# Patient Record
Sex: Male | Born: 1955 | Race: Black or African American | Hispanic: No | Marital: Married | Smoking: Never smoker
Health system: Southern US, Community
[De-identification: ages and names within clinical notes are randomized; demographics above are authoritative.]

## PROBLEM LIST (undated history)

## (undated) DIAGNOSIS — T7840XA Allergy, unspecified, initial encounter: Secondary | ICD-10-CM

## (undated) DIAGNOSIS — E785 Hyperlipidemia, unspecified: Secondary | ICD-10-CM

## (undated) DIAGNOSIS — J45909 Unspecified asthma, uncomplicated: Secondary | ICD-10-CM

## (undated) DIAGNOSIS — F329 Major depressive disorder, single episode, unspecified: Secondary | ICD-10-CM

## (undated) DIAGNOSIS — I10 Essential (primary) hypertension: Secondary | ICD-10-CM

## (undated) DIAGNOSIS — Z8719 Personal history of other diseases of the digestive system: Secondary | ICD-10-CM

## (undated) DIAGNOSIS — F32A Depression, unspecified: Secondary | ICD-10-CM

## (undated) DIAGNOSIS — E119 Type 2 diabetes mellitus without complications: Secondary | ICD-10-CM

## (undated) HISTORY — DX: Unspecified asthma, uncomplicated: J45.909

## (undated) HISTORY — DX: Essential (primary) hypertension: I10

## (undated) HISTORY — PX: SINUS IRRIGATION: SHX2411

## (undated) HISTORY — DX: Hyperlipidemia, unspecified: E78.5

## (undated) HISTORY — DX: Allergy, unspecified, initial encounter: T78.40XA

## (undated) HISTORY — DX: Type 2 diabetes mellitus without complications: E11.9

## (undated) HISTORY — DX: Major depressive disorder, single episode, unspecified: F32.9

## (undated) HISTORY — DX: Depression, unspecified: F32.A

---

## 2005-08-25 ENCOUNTER — Ambulatory Visit: Payer: Self-pay | Admitting: Internal Medicine

## 2005-09-21 ENCOUNTER — Ambulatory Visit: Payer: Self-pay | Admitting: Internal Medicine

## 2005-12-27 ENCOUNTER — Ambulatory Visit: Payer: Self-pay | Admitting: Internal Medicine

## 2005-12-27 LAB — CONVERTED CEMR LAB
ALT: 17 units/L (ref 0–40)
AST: 20 units/L (ref 0–37)
Albumin: 4.1 g/dL (ref 3.5–5.2)
Alkaline Phosphatase: 53 units/L (ref 39–117)
BUN: 8 mg/dL (ref 6–23)
Basophils Absolute: 0 10*3/uL (ref 0.0–0.1)
Basophils Relative: 0 % (ref 0.0–1.0)
CO2: 32 meq/L (ref 19–32)
Calcium: 9.2 mg/dL (ref 8.4–10.5)
Chloride: 105 meq/L (ref 96–112)
Chol/HDL Ratio, serum: 3.6
Cholesterol: 164 mg/dL (ref 0–200)
Creatinine, Ser: 1.1 mg/dL (ref 0.4–1.5)
Eosinophil percent: 3.1 % (ref 0.0–5.0)
GFR calc non Af Amer: 76 mL/min
Glomerular Filtration Rate, Af Am: 92 mL/min/{1.73_m2}
Glucose, Bld: 97 mg/dL (ref 70–99)
HCT: 47.8 % (ref 39.0–52.0)
HDL: 45.4 mg/dL (ref >39.0)
Hemoglobin: 15.2 g/dL (ref 13.0–17.0)
LDL Cholesterol: 104 mg/dL — ABNORMAL HIGH (ref 0–99)
Lymphocytes Relative: 28 % (ref 12.0–46.0)
MCHC: 31.7 g/dL (ref 30.0–36.0)
MCV: 85.3 fL (ref 78.0–100.0)
Monocytes Absolute: 0.7 10*3/uL (ref 0.2–0.7)
Monocytes Relative: 6.9 % (ref 3.0–11.0)
Neutro Abs: 6.3 10*3/uL (ref 1.4–7.7)
Neutrophils Relative %: 62 % (ref 43.0–77.0)
PSA: 0.72 ng/mL (ref 0.10–4.00)
Platelets: 281 10*3/uL (ref 150–400)
Potassium: 4.1 meq/L (ref 3.5–5.1)
RBC: 5.61 M/uL (ref 4.22–5.81)
RDW: 13.1 % (ref 11.5–14.6)
Sodium: 144 meq/L (ref 135–145)
TSH: 1.23 microintl units/mL (ref 0.35–5.50)
Total Bilirubin: 1.1 mg/dL (ref 0.3–1.2)
Total Protein: 7.8 g/dL (ref 6.0–8.3)
Triglyceride fasting, serum: 72 mg/dL (ref 0–149)
VLDL: 14 mg/dL (ref 0–40)
WBC: 10.2 10*3/uL (ref 4.5–10.5)

## 2006-01-17 ENCOUNTER — Ambulatory Visit: Payer: Self-pay | Admitting: Internal Medicine

## 2006-05-23 ENCOUNTER — Ambulatory Visit: Payer: Self-pay | Admitting: Internal Medicine

## 2006-05-23 LAB — CONVERTED CEMR LAB
ALT: 22 units/L (ref 0–40)
AST: 20 units/L (ref 0–37)
Albumin: 3.9 g/dL (ref 3.5–5.2)
Alkaline Phosphatase: 60 units/L (ref 39–117)
BUN: 11 mg/dL (ref 6–23)
Bilirubin, Direct: 0.1 mg/dL (ref 0.0–0.3)
CO2: 31 meq/L (ref 19–32)
Calcium: 9 mg/dL (ref 8.4–10.5)
Chloride: 106 meq/L (ref 96–112)
Cholesterol: 160 mg/dL (ref 0–200)
Creatinine, Ser: 0.9 mg/dL (ref 0.4–1.5)
GFR calc Af Amer: 115 mL/min
GFR calc non Af Amer: 95 mL/min
Glucose, Bld: 105 mg/dL — ABNORMAL HIGH (ref 70–99)
HDL: 40.1 mg/dL (ref >39.0)
Hgb A1c MFr Bld: 6.8 % — ABNORMAL HIGH (ref 4.6–6.0)
LDL Cholesterol: 104 mg/dL — ABNORMAL HIGH (ref 0–99)
Potassium: 3.9 meq/L (ref 3.5–5.1)
Sodium: 142 meq/L (ref 135–145)
Total Bilirubin: 1.1 mg/dL (ref 0.3–1.2)
Total CHOL/HDL Ratio: 4
Total Protein: 7.7 g/dL (ref 6.0–8.3)
Triglycerides: 81 mg/dL (ref 0–149)
VLDL: 16 mg/dL (ref 0–40)

## 2006-07-20 ENCOUNTER — Ambulatory Visit (HOSPITAL_COMMUNITY): Admission: RE | Admit: 2006-07-20 | Discharge: 2006-07-20 | Payer: Self-pay | Admitting: Internal Medicine

## 2006-07-20 ENCOUNTER — Ambulatory Visit: Payer: Self-pay | Admitting: Internal Medicine

## 2006-07-25 DIAGNOSIS — I10 Essential (primary) hypertension: Secondary | ICD-10-CM

## 2006-07-25 DIAGNOSIS — E119 Type 2 diabetes mellitus without complications: Secondary | ICD-10-CM

## 2006-07-25 DIAGNOSIS — J45909 Unspecified asthma, uncomplicated: Secondary | ICD-10-CM | POA: Insufficient documentation

## 2006-07-25 HISTORY — DX: Unspecified asthma, uncomplicated: J45.909

## 2006-07-25 HISTORY — DX: Type 2 diabetes mellitus without complications: E11.9

## 2006-07-25 HISTORY — DX: Essential (primary) hypertension: I10

## 2006-09-19 ENCOUNTER — Telehealth: Payer: Self-pay | Admitting: Internal Medicine

## 2006-10-17 ENCOUNTER — Ambulatory Visit: Payer: Self-pay | Admitting: Internal Medicine

## 2006-10-17 DIAGNOSIS — J329 Chronic sinusitis, unspecified: Secondary | ICD-10-CM | POA: Insufficient documentation

## 2006-10-19 ENCOUNTER — Telehealth: Payer: Self-pay | Admitting: Internal Medicine

## 2006-10-25 ENCOUNTER — Ambulatory Visit: Payer: Self-pay | Admitting: Internal Medicine

## 2008-01-04 ENCOUNTER — Ambulatory Visit: Payer: Self-pay | Admitting: Internal Medicine

## 2008-01-04 ENCOUNTER — Telehealth: Payer: Self-pay | Admitting: Internal Medicine

## 2008-01-07 DIAGNOSIS — E785 Hyperlipidemia, unspecified: Secondary | ICD-10-CM | POA: Insufficient documentation

## 2008-01-07 HISTORY — DX: Hyperlipidemia, unspecified: E78.5

## 2008-01-09 LAB — CONVERTED CEMR LAB
ALT: 24 units/L (ref 0–53)
AST: 22 units/L (ref 0–37)
CO2: 29 meq/L (ref 19–32)
Calcium: 9.8 mg/dL (ref 8.4–10.5)
Chloride: 103 meq/L (ref 96–112)
Hgb A1c MFr Bld: 6.7 % — ABNORMAL HIGH (ref 4.6–6.0)
Potassium: 4.2 meq/L (ref 3.5–5.1)
Sodium: 142 meq/L (ref 135–145)
Total CHOL/HDL Ratio: 5.3
Triglycerides: 93 mg/dL (ref 0–149)

## 2008-03-07 ENCOUNTER — Telehealth: Payer: Self-pay | Admitting: Internal Medicine

## 2008-10-03 ENCOUNTER — Encounter: Payer: Self-pay | Admitting: Internal Medicine

## 2008-10-14 IMAGING — CR DG CHEST 2V
2 series · 2 of 2 positions shown · non-contrast
Comparison: none

CLINICAL DATA: Chest tightness, cough, asthma, hypertension.
 CHEST ? 2 VIEW:

[w chest pa]
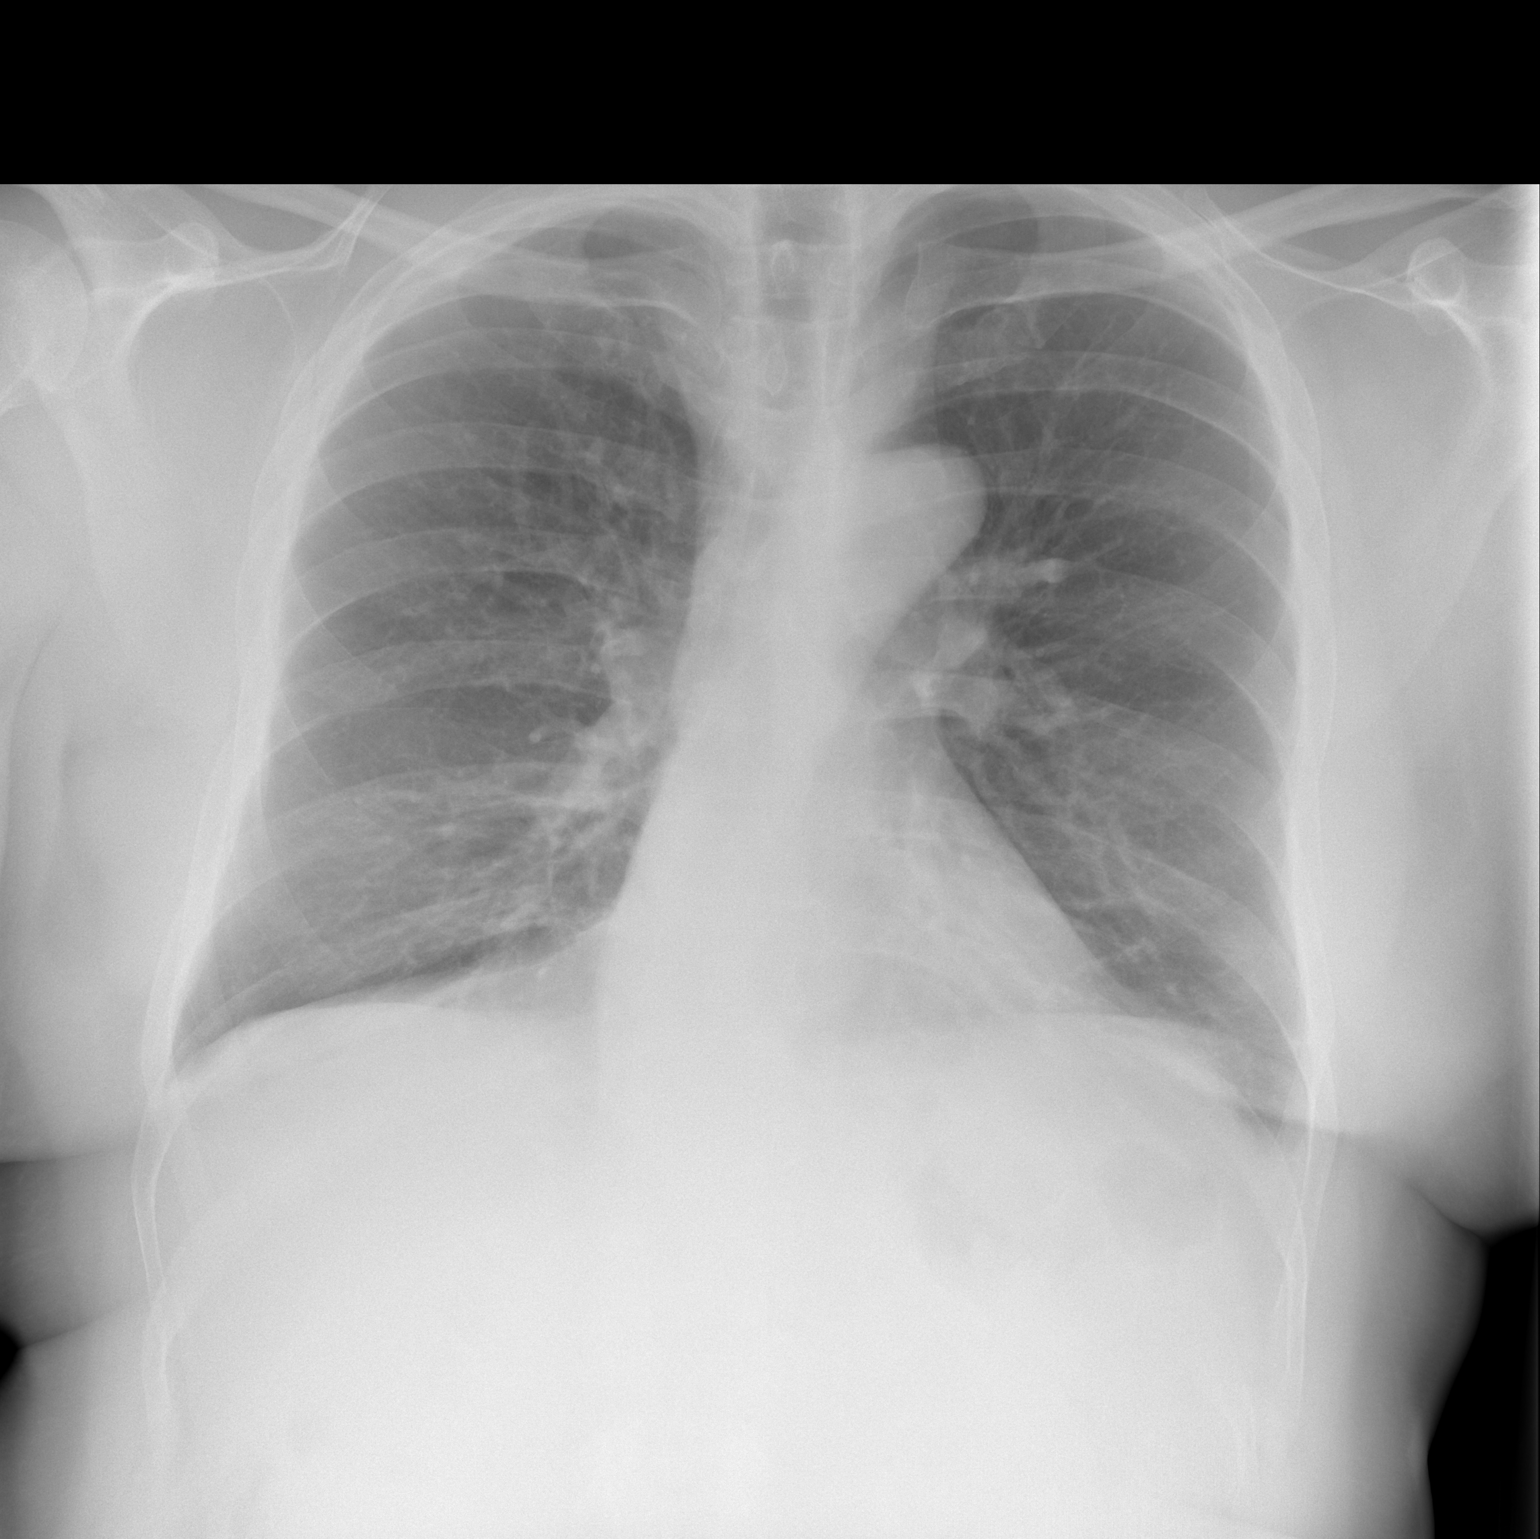

[w chest lat]
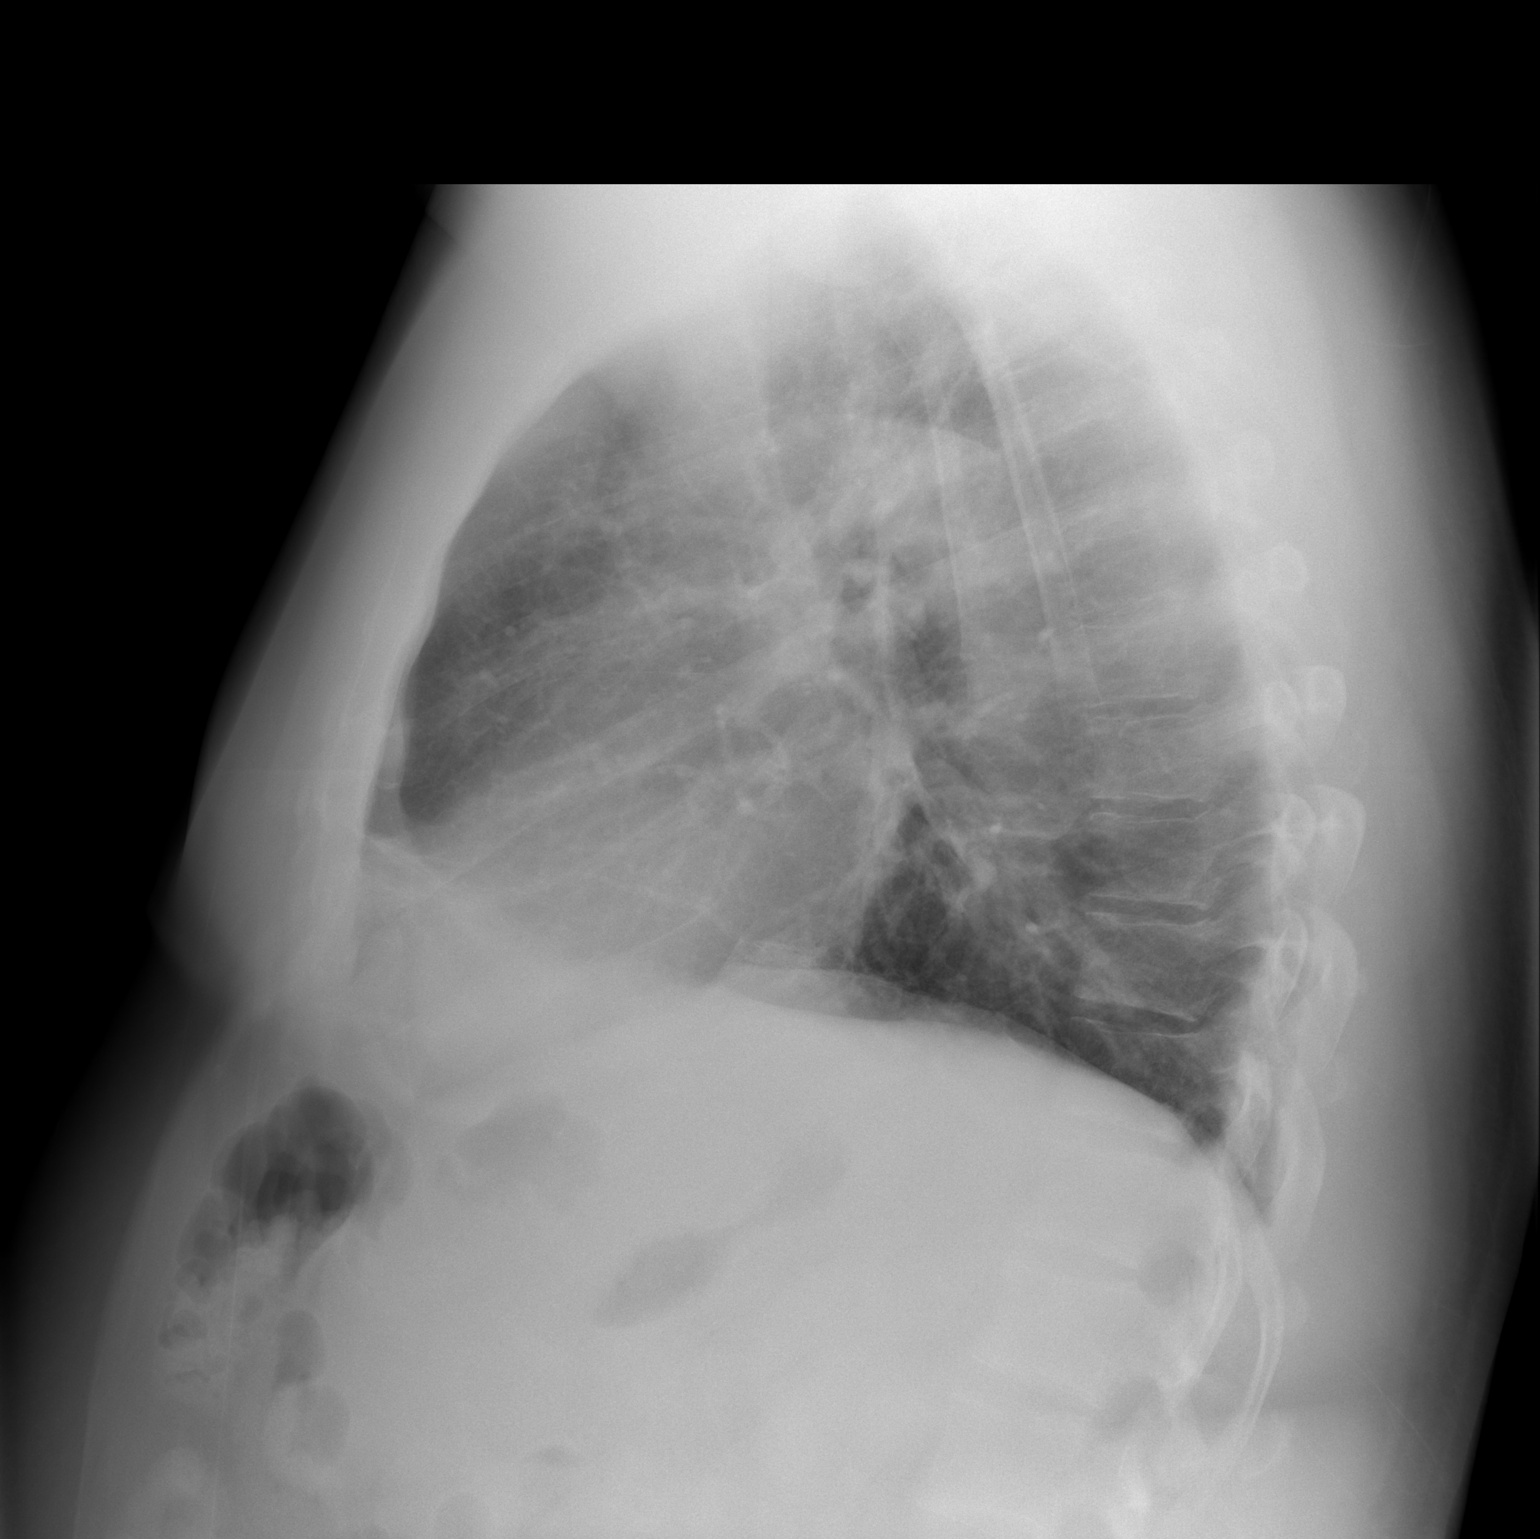

[2 of 2 positions shown; findings below may reference images not displayed]

FINDINGS: Heart size is normal.  The thoracic aorta is slightly unfolded.  The mediastinal shadows are unremarkable.  There are linear pulmonary densities in both lower lungs. This could be scarring or mild atelectasis.  No evidence of consolidation or lobar collapse.  Bony structures are unremarkable.
IMPRESSION: Linear markings in both lower lobes.  It could be scarring or mild atelectasis.

## 2008-10-24 ENCOUNTER — Ambulatory Visit: Payer: Self-pay | Admitting: Internal Medicine

## 2008-10-24 ENCOUNTER — Encounter (INDEPENDENT_AMBULATORY_CARE_PROVIDER_SITE_OTHER): Payer: Self-pay | Admitting: *Deleted

## 2008-10-24 DIAGNOSIS — K625 Hemorrhage of anus and rectum: Secondary | ICD-10-CM

## 2008-10-25 ENCOUNTER — Ambulatory Visit: Payer: Self-pay | Admitting: Gastroenterology

## 2008-10-25 ENCOUNTER — Telehealth (INDEPENDENT_AMBULATORY_CARE_PROVIDER_SITE_OTHER): Payer: Self-pay | Admitting: *Deleted

## 2008-10-25 LAB — CONVERTED CEMR LAB
Eosinophils Absolute: 0.2 10*3/uL (ref 0.0–0.7)
Eosinophils Relative: 2.7 % (ref 0.0–5.0)
MCV: 84.8 fL (ref 78.0–100.0)
Monocytes Absolute: 0.7 10*3/uL (ref 0.1–1.0)
Neutrophils Relative %: 57.7 % (ref 43.0–77.0)
Platelets: 209 10*3/uL (ref 150.0–400.0)
WBC: 8.9 10*3/uL (ref 4.5–10.5)

## 2008-12-13 ENCOUNTER — Ambulatory Visit: Payer: Self-pay | Admitting: Gastroenterology

## 2008-12-13 LAB — HM COLONOSCOPY

## 2008-12-25 ENCOUNTER — Encounter: Payer: Self-pay | Admitting: Gastroenterology

## 2009-01-11 IMAGING — CT CT PARANASAL SINUSES LIMITED
1 of 2 series · 16 of 28 positions shown, 20 images · non-contrast
Comparison: none

CLINICAL DATA: Sinus drainage. Frontal headaches. Congestion.
 LIMITED CT OF PARANASAL SINUSES:
TECHNIQUE: Limited coronal CT images were obtained through the paranasal sinuses without intravenous contrast.

[Series 4: ltd sinus 3.0 h30s · axial · 0.29mm/px · z∈[-99,+13]mm · 16 of 27 slices shown, 20 images]
[im 2/27  brain]
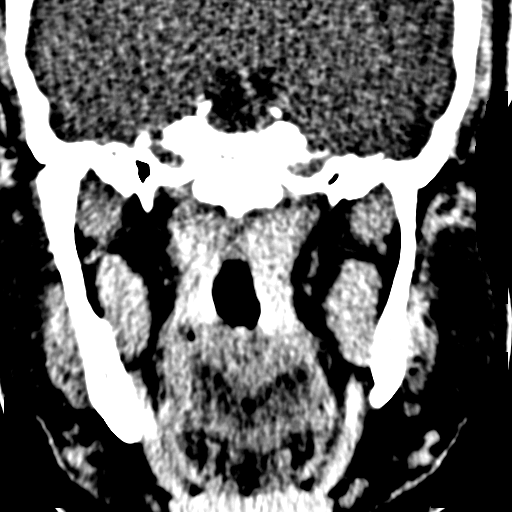
[im 2/27  bone]
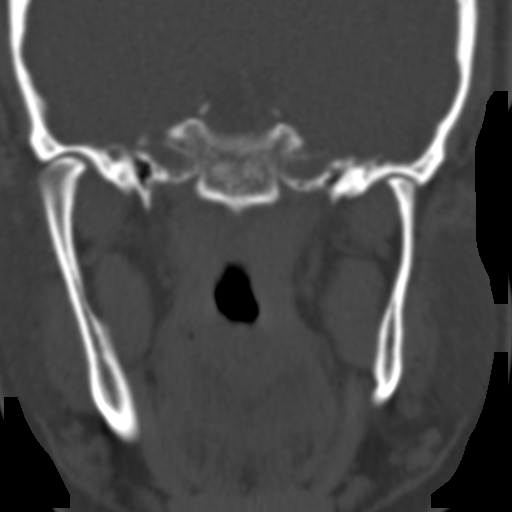
[im 4/27  bone]
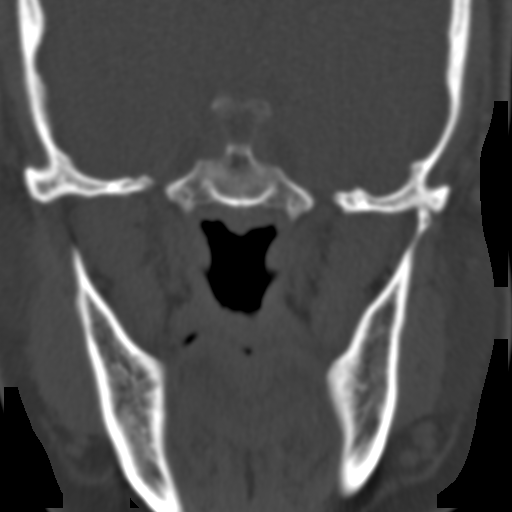
[im 5/27  bone]
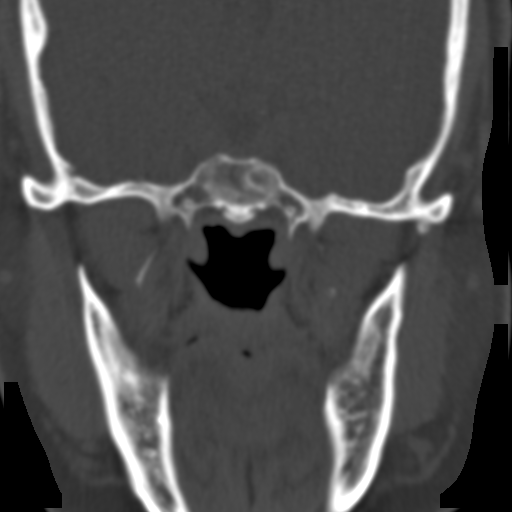
[im 7/27  bone]
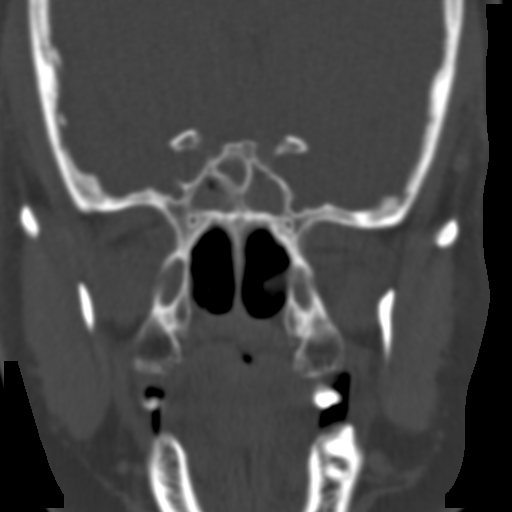
[im 9/27  brain]
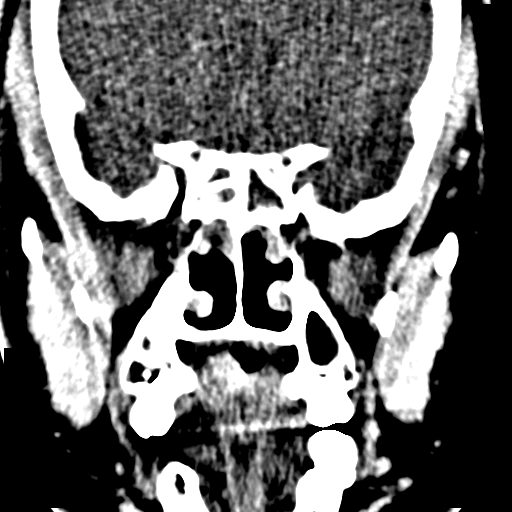
[im 9/27  bone]
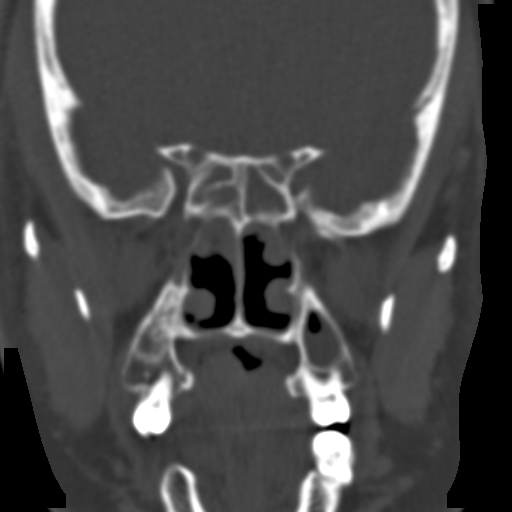
[im 10/27  bone]
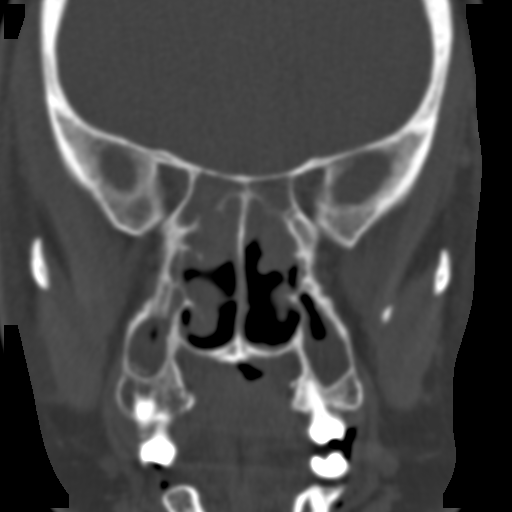
[im 12/27  bone]
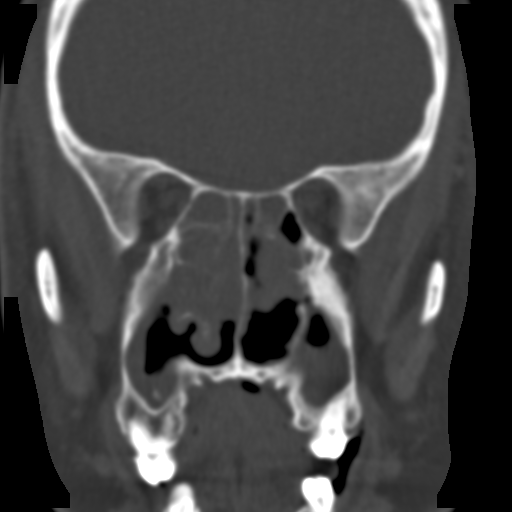
[im 13/27  bone]
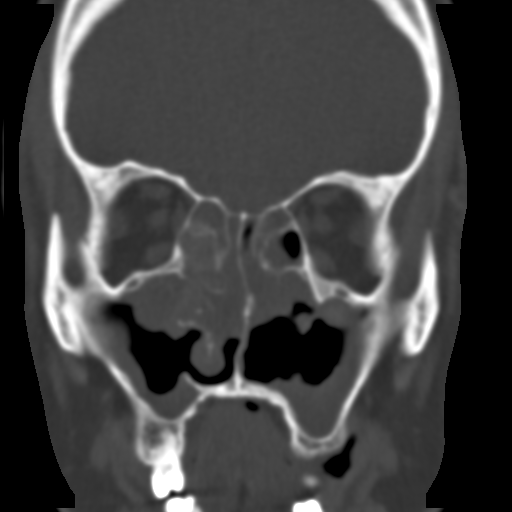
[im 15/27  brain]
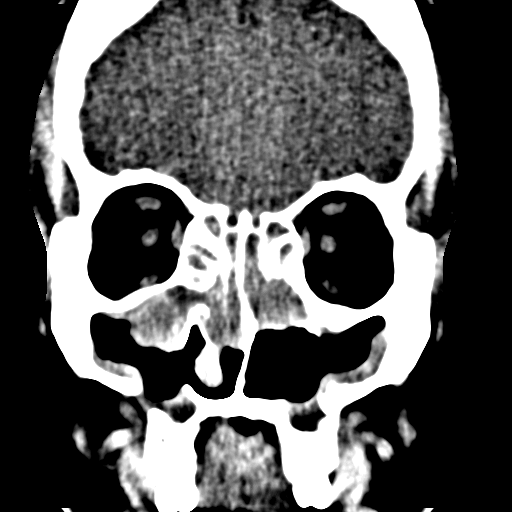
[im 15/27  bone]
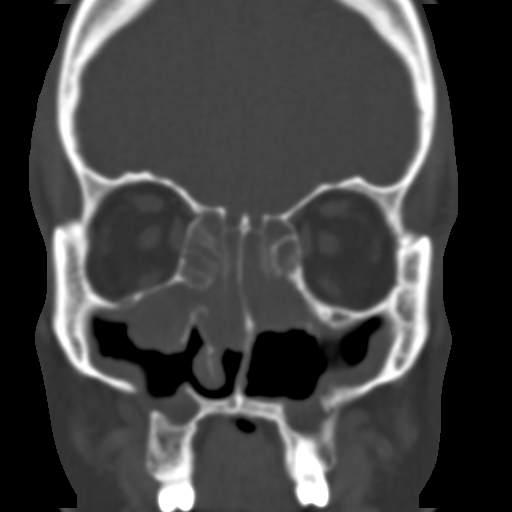
[im 16/27  bone]
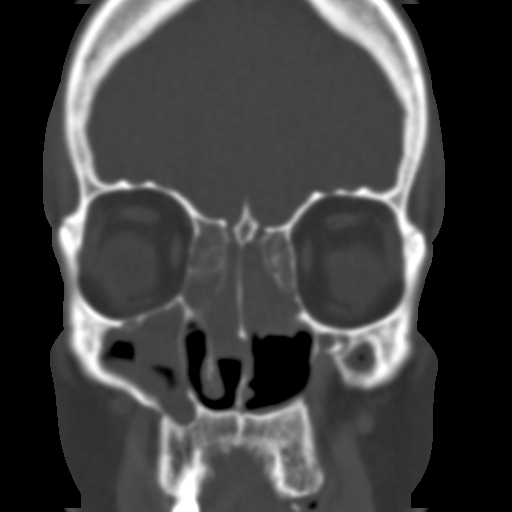
[im 18/27  bone]
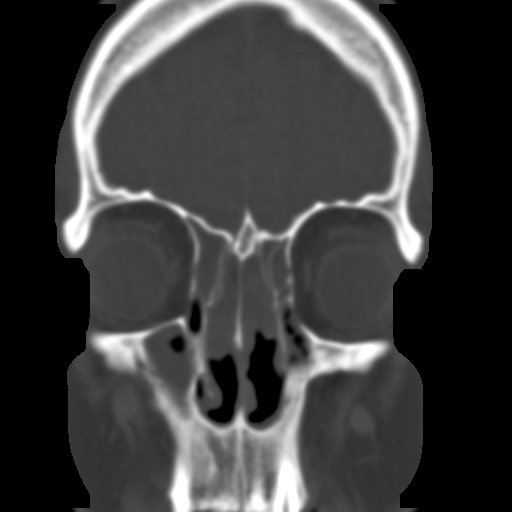
[im 19/27  bone]
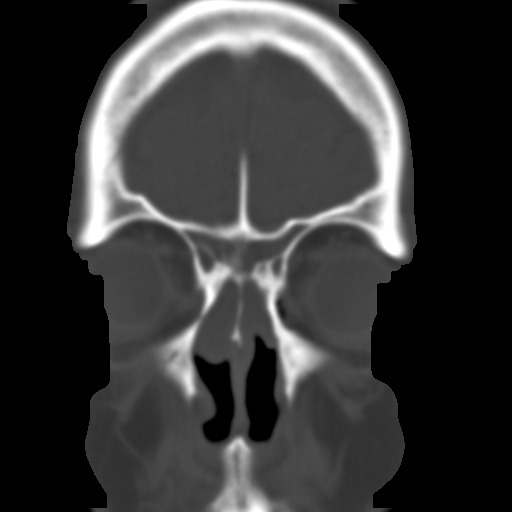
[im 21/27  brain]
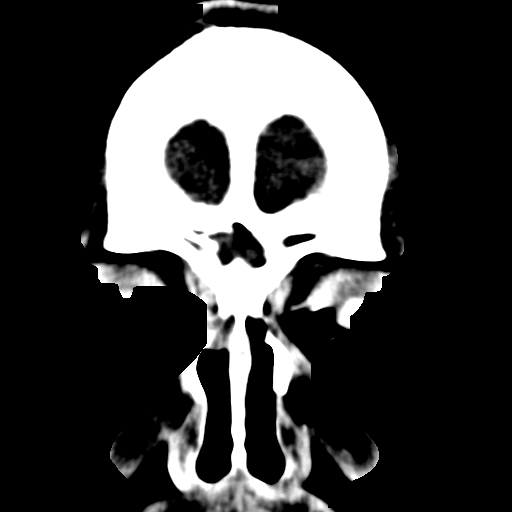
[im 21/27  bone]
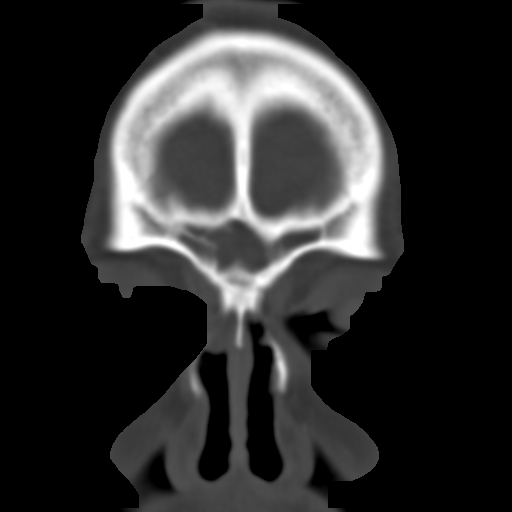
[im 23/27  bone]
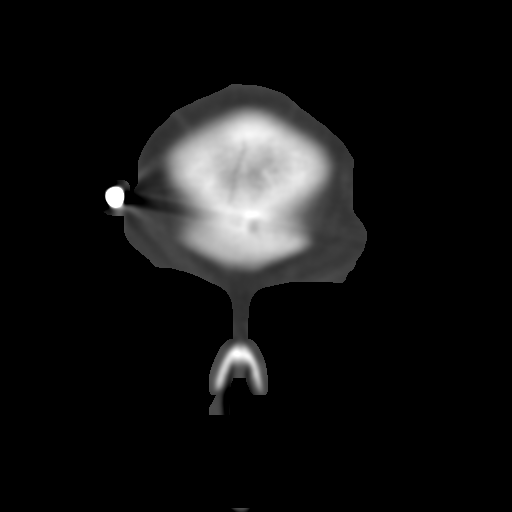
[im 24/27  bone]
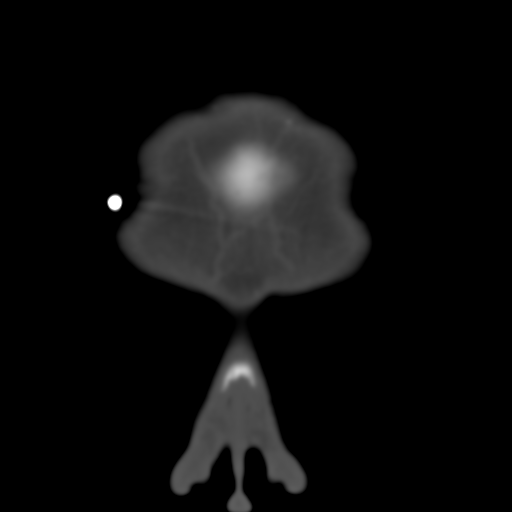
[im 26/27  bone]
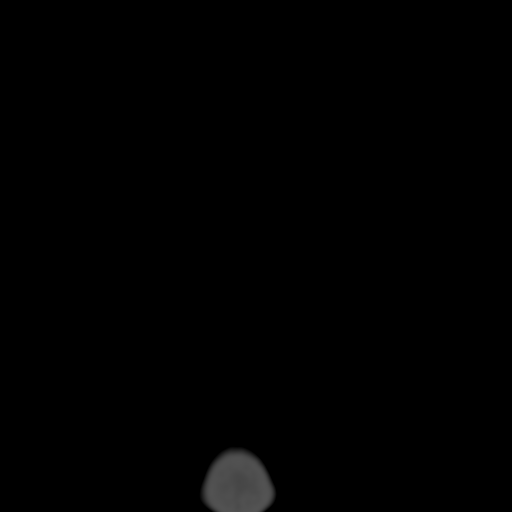

[16 of 28 positions shown; findings below may reference images not displayed]

FINDINGS: The patient appears to have undergone prior bilateral medial antrostomies.  There is opacification of the sphenoid sinus with opacification of the majority of ethmoid air cells and the frontal sinuses.  Nodular mucosal thickening is noted diffusely throughout both maxillary sinuses.  Findings are consistent with pansinusitis.  The nodular character of the mucosal thickening may indicate sinonasal polyposis.  The left inferior turbinate is not visible.  No bony erosion is seen.
IMPRESSION: Pan sinusitis. Consider sinonasal polyposis.

## 2009-04-04 ENCOUNTER — Telehealth: Payer: Self-pay | Admitting: Internal Medicine

## 2010-02-24 NOTE — Progress Notes (Signed)
Summary: simvastatin--needs ov  Phone Note Refill Request Message from:  Fax from Pharmacy  Refills Requested: Medication #1:  SIMVASTATIN 40 MG  TABS one by mouth daily [facility] [phone]      FAX----[phone]  Initial call taken by: Warnell Forester,  April 04, 2009 3:50 PM    New/Updated Medications: SIMVASTATIN 40 MG  TABS (SIMVASTATIN) one by mouth daily--NEEDS OFFICE VISIT FOR ADDITIONAL REFILLS Prescriptions: SIMVASTATIN 40 MG  TABS (SIMVASTATIN) one by mouth daily--NEEDS OFFICE VISIT FOR ADDITIONAL REFILLS  #30 x 0   Entered by:   Gladis Riffle, RN   Authorized by:   Birdie Sons MD   Signed by:   Gladis Riffle, RN on 04/07/2009   Method used:   Electronically to        [facility]. [phone]* (retail)       [address]       [place], [place]  [address]       Ph: [phone]       Fax: [phone]   RxID:   [id number]

## 2010-03-09 ENCOUNTER — Ambulatory Visit (INDEPENDENT_AMBULATORY_CARE_PROVIDER_SITE_OTHER): Payer: 59 | Admitting: Internal Medicine

## 2010-03-09 ENCOUNTER — Encounter: Payer: Self-pay | Admitting: Internal Medicine

## 2010-03-09 DIAGNOSIS — I10 Essential (primary) hypertension: Secondary | ICD-10-CM

## 2010-03-09 DIAGNOSIS — E119 Type 2 diabetes mellitus without complications: Secondary | ICD-10-CM

## 2010-03-09 DIAGNOSIS — E785 Hyperlipidemia, unspecified: Secondary | ICD-10-CM

## 2010-03-09 DIAGNOSIS — N318 Other neuromuscular dysfunction of bladder: Secondary | ICD-10-CM

## 2010-03-09 DIAGNOSIS — N3281 Overactive bladder: Secondary | ICD-10-CM

## 2010-03-09 LAB — LIPID PANEL
HDL: 42.3 mg/dL (ref >39.00)
Total CHOL/HDL Ratio: 5
Triglycerides: 223 mg/dL — ABNORMAL HIGH (ref 0.0–149.0)

## 2010-03-09 LAB — HEPATIC FUNCTION PANEL
Alkaline Phosphatase: 137 U/L — ABNORMAL HIGH (ref 39–117)
Bilirubin, Direct: 0.1 mg/dL (ref 0.0–0.3)
Total Bilirubin: 0.9 mg/dL (ref 0.3–1.2)

## 2010-03-09 LAB — BASIC METABOLIC PANEL
Calcium: 9.9 mg/dL (ref 8.4–10.5)
GFR: 82.83 mL/min (ref >60.00)
Sodium: 132 mEq/L — ABNORMAL LOW (ref 135–145)

## 2010-03-09 LAB — POCT URINALYSIS DIPSTICK
Ketones, UA: NEGATIVE
Nitrite, UA: NEGATIVE
Protein, UA: NEGATIVE
Urobilinogen, UA: 0.2

## 2010-03-09 LAB — POCT CBG (FASTING - GLUCOSE)-MANUAL ENTRY: Glucose Fasting, POC: HIGH mg/dL

## 2010-03-09 MED ORDER — INSULIN REGULAR HUMAN 100 UNIT/ML IJ SOLN
10.0000 [IU] | INTRAMUSCULAR | Status: AC
  Administered 2010-03-09 (×2): 10 [IU] via SUBCUTANEOUS

## 2010-03-09 MED ORDER — SIMVASTATIN 40 MG PO TABS: 40.0000 mg | ORAL_TABLET | Freq: Every day | ORAL | Status: DC

## 2010-03-09 MED ORDER — METFORMIN HCL ER (MOD) 500 MG PO TB24: 500.0000 mg | ORAL_TABLET | Freq: Two times a day (BID) | ORAL | Status: DC

## 2010-03-09 MED ORDER — INSULIN REGULAR HUMAN 100 UNIT/ML IJ SOLN
10.0000 [IU] | Freq: Once | INTRAMUSCULAR | Status: AC
  Administered 2010-03-09: 10 [IU] via SUBCUTANEOUS

## 2010-03-09 MED ORDER — GLYBURIDE 2.5 MG PO TABS: 2.5000 mg | ORAL_TABLET | Freq: Every day | ORAL | Status: DC

## 2010-03-09 MED ORDER — OLMESARTAN MEDOXOMIL-HCTZ 40-25 MG PO TABS: 1.0000 | ORAL_TABLET | Freq: Every day | ORAL | Status: DC

## 2010-03-09 NOTE — Progress Notes (Signed)
  Subjective:    Patient ID: Christopher Serrano, male    DOB: 1955-06-19, 55 y.o.   MRN: [id number]  HPI   patient comes in for 2 complaints. He complains of polyuria, polydipsia, polyphagia. This ongoing for 4 days. He's urinating several times per hour. He is waking up in regard to urinate. Has been drinking a lot of water and cranberry juice. He has not been taking his diabetic medications. He denies any fever chills or any other specific complaints and a complete review of systems. In addition to not taking his metformin he has been out of simvastatin for 3 or 4 days he continues to take his Benicar.  Past Medical History  Diagnosis Date  . DIABETES MELLITUS, TYPE II 07/25/2006  . HYPERTENSION 07/25/2006  . ASTHMA 07/25/2006  . HYPERLIPIDEMIA 01/07/2008   Past Surgical History  Procedure Date  . Sinus irrigation     reports that he has never smoked. He does not have any smokeless tobacco history on file. He reports that he drinks alcohol. He reports that he does not use illicit drugs. family history includes Dementia in his father; Diabetes in his brother; and Hypertension in his mother.  There is no history of Colon cancer.     Review of Systems  patient denies chest pain, shortness of breath, orthopnea. Denies lower extremity edema, abdominal pain, change in appetite, change in bowel movements. Patient denies rashes, musculoskeletal complaints. No other specific complaints in a complete review of systems.      Objective:   Physical Exam  well-developed well-nourished male in no acute distress. HEENT exam atraumatic, normocephalic, neck supple without jugular venous distention. Chest clear to auscultation cardiac exam S1-S2 are regular. Abdominal exam overweight with bowel sounds, soft and nontender. Extremities no edema. Neurologic exam is alert with a normal gait.        Assessment & Plan:

## 2010-03-09 NOTE — Assessment & Plan Note (Signed)
Will check labs today. Continue simvastatin for the time being.

## 2010-03-09 NOTE — Assessment & Plan Note (Addendum)
Blood sugar in the office today was read as high. No documented number. He was hydrated with oral fluids. He was given 10 units of Humalog. We used a sample supply. He'll be monitored in the office and tell his blood sugars are in a readable range. He was advised on diet and exercise. For the time being he will avoid nearly all carbohydrates. He will avoid all concentrated  Sweets. He will limit his diet to lean meats and vegetables and eggs for the time being. I'll see him back in 4-5 days. All of this is discussed with the patient and his wife.   the patient was given multiple doses of Humalog in the office. He was given oral fluids in the office. He was monitored in the office for 3 hours total time.

## 2010-03-09 NOTE — Assessment & Plan Note (Signed)
Adequate control. Continue current medications. 

## 2010-03-10 ENCOUNTER — Ambulatory Visit (INDEPENDENT_AMBULATORY_CARE_PROVIDER_SITE_OTHER): Payer: 59 | Admitting: Internal Medicine

## 2010-03-10 DIAGNOSIS — E119 Type 2 diabetes mellitus without complications: Secondary | ICD-10-CM

## 2010-03-10 NOTE — Progress Notes (Signed)
  Subjective:    Patient ID: Christopher Serrano, male    DOB: 1955-12-26, 55 y.o.   MRN: [id number]  HPI  patient comes in for followup of diabetes. Reviewed yesterday's note. Reviewed past medical history social history, family history, surgical history. He is feeling much better. Less polyuria, polydipsia. He's been maintaining an excellent diet over the past 24 hours.   Review of Systems     Objective:   Physical Exam        Assessment & Plan:

## 2010-03-10 NOTE — Assessment & Plan Note (Signed)
Uncontrolled diabetes. Much improved. Reviewed labs from yesterday including a blood sugar 707. Blood sugar this morning is 374. Much improved. He'll continue to maintain incredibly low carbohydrate diet over the next 2 weeks. Monitor his blood sugar daily. Glucometer given to the patient today. Greater than 15 minutes spent with the patient and his wife SLING.

## 2010-03-26 ENCOUNTER — Encounter: Payer: Self-pay | Admitting: Internal Medicine

## 2010-03-26 ENCOUNTER — Ambulatory Visit (INDEPENDENT_AMBULATORY_CARE_PROVIDER_SITE_OTHER): Payer: 59 | Admitting: Internal Medicine

## 2010-03-26 VITALS — BP 112/90 | Temp 98.4°F | Wt 296.0 lb

## 2010-03-26 DIAGNOSIS — E119 Type 2 diabetes mellitus without complications: Secondary | ICD-10-CM

## 2010-03-29 NOTE — Progress Notes (Signed)
  Subjective:    Patient ID: Christopher Serrano, male    DOB: 07-04-1955, 55 y.o.   MRN: [id number]  HPI   patient comes in for followup of multiple medical problems including type 2 diabetes, hyperlipidemia, hypertension. The patient does not check blood sugar or blood pressure at home. The patetient does not follow an exercise or diet program. The patient denies any polyuria, polydipsia.  In the past the patient has gone to diabetic treatment center. The patient is tolerating medications  Without difficulty. The patient does admit to medication compliance.   Past Medical History  Diagnosis Date  . DIABETES MELLITUS, TYPE II 07/25/2006  . HYPERTENSION 07/25/2006  . ASTHMA 07/25/2006  . HYPERLIPIDEMIA 01/07/2008   Past Surgical History  Procedure Date  . Sinus irrigation     reports that he has never smoked. He does not have any smokeless tobacco history on file. He reports that he drinks alcohol. He reports that he does not use illicit drugs. family history includes Dementia in his father; Diabetes in his brother; and Hypertension in his mother.  There is no history of Colon cancer.    Allergies  Allergen Reactions  . Aspirin   . Ibuprofen     REACTION: sob/tightness     Review of Systems  patient denies chest pain, shortness of breath, orthopnea. Denies lower extremity edema, abdominal pain, change in appetite, change in bowel movements. Patient denies rashes, musculoskeletal complaints. No other specific complaints in a complete review of systems.      Objective:   Physical Exam  Well-developed well-nourished male in no acute distress. HEENT exam atraumatic, normocephalic, extraocular muscles are intact. Neck is supple. No jugular venous distention no thyromegaly. Chest clear to auscultation without increased work of breathing. Cardiac exam S1 and S2 are regular. Abdominal exam active bowel sounds, soft, nontender. Extremities no edema. Neurologic exam she is alert without any motor  sensory deficits. Gait is normal.        Assessment & Plan:

## 2010-03-29 NOTE — Assessment & Plan Note (Signed)
Lab Results  Component Value Date   HGBA1C 11.4* 03/09/2010   POCGLU 376 03/10/2010   CREATININE 1.2 03/09/2010   CHOL 217* 03/09/2010   LDL 104* 05/23/2006   HDL 42.30 03/09/2010   TRIG 223.0* 03/09/2010    Last eye exam and foot exam: patient will schedule an eye and foot exam.  Assessment: Diabetes control: not controlled Progress toward goals: improved Barriers to meeting goals:  patient was noncompliant wwiithh dieett eexercise annd medications. this is improved. he will continue low carbohydrate ddiet. Have advised regular exercisee.Leanne Lovely  aaddvviiseedd progressive weight loss.  Plan: Diabetes treatment: continue current medications Refer to: none Instruction/counseling given: reminded to get eye exam, reminded to bring medications to each visit, discussed foot care, discussed the need for weight loss and discussed diet

## 2010-05-06 ENCOUNTER — Telehealth: Payer: Self-pay | Admitting: *Deleted

## 2010-05-06 DIAGNOSIS — E119 Type 2 diabetes mellitus without complications: Secondary | ICD-10-CM

## 2010-05-06 NOTE — Telephone Encounter (Signed)
Pt was switched from metformin to glipizide.  Glipizide is not covered under his insurance and he wants to go back metformin.  The metformin worked

## 2010-05-07 MED ORDER — GLYBURIDE 2.5 MG PO TABS: 2.5000 mg | ORAL_TABLET | Freq: Every day | ORAL | Status: DC

## 2010-05-07 NOTE — Telephone Encounter (Signed)
rx was [facility], its possible that he could get it free there.

## 2010-06-12 NOTE — Assessment & Plan Note (Signed)
[facility]                                 ON-CALL NOTE   AURELIANO, OSHIELDS                        MRN:          [id number]  DATE:12/14/2008                            DOB:          07-23-1955    Mr. Zayas wife called to say that her husband is complaining of  being cold.  He underwent colonoscopy earlier in the day.  He is without  fever or chills, but has been having difficulty warming up.  He is  without abdominal pain.   I instructed Mrs. Knick to contact me if he develops pain, fever, or  chills.     Barbette Hair. Arlyce Dice, MD,FACG  Electronically Signed    RDK/MedQ  DD: 12/14/2008  DT: 12/15/2008  Job #: [id number]   cc:   Rachael Fee, MD

## 2010-06-29 ENCOUNTER — Other Ambulatory Visit: Payer: 59

## 2010-07-06 ENCOUNTER — Ambulatory Visit: Payer: 59 | Admitting: Internal Medicine

## 2011-03-12 ENCOUNTER — Other Ambulatory Visit: Payer: Self-pay | Admitting: Internal Medicine

## 2011-03-16 ENCOUNTER — Other Ambulatory Visit: Payer: Self-pay

## 2011-03-16 NOTE — Telephone Encounter (Signed)
Error

## 2011-04-23 ENCOUNTER — Other Ambulatory Visit: Payer: Self-pay | Admitting: Internal Medicine

## 2011-09-30 ENCOUNTER — Encounter: Payer: Self-pay | Admitting: Family Medicine

## 2011-09-30 ENCOUNTER — Ambulatory Visit (INDEPENDENT_AMBULATORY_CARE_PROVIDER_SITE_OTHER): Payer: 59 | Admitting: Family Medicine

## 2011-09-30 VITALS — BP 140/120 | Temp 98.2°F | Wt 290.0 lb

## 2011-09-30 DIAGNOSIS — J019 Acute sinusitis, unspecified: Secondary | ICD-10-CM

## 2011-09-30 DIAGNOSIS — I10 Essential (primary) hypertension: Secondary | ICD-10-CM

## 2011-09-30 DIAGNOSIS — R51 Headache: Secondary | ICD-10-CM

## 2011-09-30 MED ORDER — FLUTICASONE PROPIONATE 50 MCG/ACT NA SUSP: 2.0000 | Freq: Every day | NASAL | Status: DC

## 2011-09-30 MED ORDER — AZITHROMYCIN 250 MG PO TABS: ORAL_TABLET | ORAL | Status: AC

## 2011-09-30 NOTE — Progress Notes (Signed)
  Subjective:    Patient ID: Christopher Serrano, male    DOB: 05-Oct-1955, 56 y.o.   MRN: [id number]  HPI  Patient seen for the following issues.  Acute headache. Started yesterday. Bilateral frontal sinus pressure. He has some maxillary and frontal sinus pressure. History of nasal polyps. Yellowish nasal discharge. Some nausea yesterday but no vomiting. Took Tylenol with minimal relief. No fever or chills. No cough. Some postnasal drip symptoms.  Hypertension. Currently off medication. Treated with Benicar HCTZ and ran out of medication about one month ago. He has history of type 2 diabetes. Poor compliance. Needs follow up with primary.  Past Medical History  Diagnosis Date  . DIABETES MELLITUS, TYPE II 07/25/2006  . HYPERTENSION 07/25/2006  . ASTHMA 07/25/2006  . HYPERLIPIDEMIA 01/07/2008   Past Surgical History  Procedure Date  . Sinus irrigation     reports that he has never smoked. He does not have any smokeless tobacco history on file. He reports that he drinks alcohol. He reports that he does not use illicit drugs. family history includes Dementia in his father; Diabetes in his brother; and Hypertension in his mother.  There is no history of Colon cancer. Allergies  Allergen Reactions  . Aspirin   . Ibuprofen     REACTION: sob/tightness      Review of Systems  Constitutional: Positive for fatigue. Negative for fever and chills.  HENT: Positive for congestion and sinus pressure.   Eyes: Negative for visual disturbance.  Respiratory: Negative for cough and shortness of breath.   Cardiovascular: Negative for chest pain.  Neurological: Positive for headaches. Negative for dizziness, syncope and weakness.  Hematological: Negative for adenopathy. Does not bruise/bleed easily.       Objective:   Physical Exam  Constitutional: He is oriented to person, place, and time. He appears well-developed and well-nourished.  HENT:  Right Ear: External ear normal.  Left Ear: External ear  normal.       Thick yellow mucus posterior pharynx otherwise clear. Nasal exam reveals polyps left naris  Neck: Neck supple.  Cardiovascular: Normal rate and regular rhythm.   Pulmonary/Chest: Effort normal and breath sounds normal. No respiratory distress. He has no wheezes. He has no rales.  Lymphadenopathy:    He has no cervical adenopathy.  Neurological: He is alert and oriented to person, place, and time. No cranial nerve deficit.          Assessment & Plan:  #1 headaches. Possibly combination of acute sinusitis and also possibly related to poorly controlled hypertension.  #2 acute sinusitis start Zithromax. Flonase nasal 2 sprays per nostril once daily. He has nasal polyps which have recurred following prior operation #3 hypertension. Poorly controlled. Provided samples of Benicar HCTZ to get back on. Schedule followup with primary in one month

## 2011-10-08 ENCOUNTER — Other Ambulatory Visit: Payer: Self-pay | Admitting: Internal Medicine

## 2011-10-08 DIAGNOSIS — E119 Type 2 diabetes mellitus without complications: Secondary | ICD-10-CM

## 2011-10-08 MED ORDER — GLYBURIDE 2.5 MG PO TABS: 2.5000 mg | ORAL_TABLET | Freq: Every day | ORAL | Status: DC

## 2011-10-08 MED ORDER — METFORMIN HCL ER (MOD) 500 MG PO TB24: 500.0000 mg | ORAL_TABLET | Freq: Two times a day (BID) | ORAL | Status: DC

## 2011-10-08 NOTE — Telephone Encounter (Signed)
Pt needs new rxs glyburide and metformin call into [facility] [phone]. Pt is out

## 2011-10-08 NOTE — Telephone Encounter (Signed)
rx sent in electronically 

## 2011-11-15 ENCOUNTER — Ambulatory Visit: Payer: 59 | Admitting: Internal Medicine

## 2011-12-01 ENCOUNTER — Encounter: Payer: Self-pay | Admitting: Gastroenterology

## 2011-12-24 ENCOUNTER — Ambulatory Visit (INDEPENDENT_AMBULATORY_CARE_PROVIDER_SITE_OTHER): Payer: 59 | Admitting: Internal Medicine

## 2011-12-24 ENCOUNTER — Encounter: Payer: Self-pay | Admitting: Internal Medicine

## 2011-12-24 VITALS — BP 122/80 | HR 86 | Temp 98.5°F | Wt 283.0 lb

## 2011-12-24 DIAGNOSIS — J45909 Unspecified asthma, uncomplicated: Secondary | ICD-10-CM

## 2011-12-24 DIAGNOSIS — Z23 Encounter for immunization: Secondary | ICD-10-CM

## 2011-12-24 DIAGNOSIS — I10 Essential (primary) hypertension: Secondary | ICD-10-CM

## 2011-12-24 DIAGNOSIS — E785 Hyperlipidemia, unspecified: Secondary | ICD-10-CM

## 2011-12-24 DIAGNOSIS — E119 Type 2 diabetes mellitus without complications: Secondary | ICD-10-CM

## 2011-12-24 LAB — BASIC METABOLIC PANEL
CO2: 29 mEq/L (ref 19–32)
Calcium: 9.7 mg/dL (ref 8.4–10.5)
Chloride: 99 mEq/L (ref 96–112)
Sodium: 138 mEq/L (ref 135–145)

## 2011-12-24 LAB — HEPATIC FUNCTION PANEL
ALT: 27 U/L (ref 0–53)
Total Bilirubin: 0.9 mg/dL (ref 0.3–1.2)

## 2011-12-24 LAB — HEMOGLOBIN A1C: Hgb A1c MFr Bld: 7.8 % — ABNORMAL HIGH (ref 4.6–6.5)

## 2011-12-24 LAB — LIPID PANEL
Cholesterol: 151 mg/dL (ref 0–200)
VLDL: 15.2 mg/dL (ref 0.0–40.0)

## 2011-12-24 MED ORDER — METFORMIN HCL ER (MOD) 500 MG PO TB24: 500.0000 mg | ORAL_TABLET | Freq: Two times a day (BID) | ORAL | Status: DC

## 2011-12-24 MED ORDER — SIMVASTATIN 40 MG PO TABS: 40.0000 mg | ORAL_TABLET | Freq: Every day | ORAL | Status: DC

## 2011-12-24 MED ORDER — GLYBURIDE 2.5 MG PO TABS: 2.5000 mg | ORAL_TABLET | Freq: Every day | ORAL | Status: DC

## 2011-12-24 NOTE — Assessment & Plan Note (Signed)
Bp= 122/80 Continue same meds

## 2011-12-24 NOTE — Assessment & Plan Note (Signed)
He has no sxs and has not required inhaler

## 2011-12-24 NOTE — Assessment & Plan Note (Signed)
Has been out of meds Will check labs today

## 2011-12-24 NOTE — Assessment & Plan Note (Signed)
Has been out of meds for anywhere between 1- 6 weeks.

## 2011-12-24 NOTE — Progress Notes (Signed)
Patient ID: Christopher Serrano, male   DOB: Sep 20, 1955, 56 y.o.   MRN: [id number]   patient comes in for followup of multiple medical problems including type 2 diabetes, hyperlipidemia, hypertension. The patient does not check blood sugar or blood pressure at home. The patetient does not follow an exercise or diet program. The patient denies any polyuria, polydipsia.  In the past the patient has gone to diabetic treatment center. The patient is tolerating medications  Without difficulty. The patient does admit to medication compliance.   Past Medical History  Diagnosis Date  . DIABETES MELLITUS, TYPE II 07/25/2006  . HYPERTENSION 07/25/2006  . ASTHMA 07/25/2006  . HYPERLIPIDEMIA 01/07/2008    History   Social History  . Marital Status: Married    Spouse Name: N/A    Number of Children: N/A  . Years of Education: N/A   Occupational History  . Not on file.   Social History Main Topics  . Smoking status: Never Smoker   . Smokeless tobacco: Not on file  . Alcohol Use: Yes     Comment: every 6 -7 mths  . Drug Use: No  . Sexually Active: Not on file   Other Topics Concern  . Not on file   Social History Narrative  . No narrative on file    Past Surgical History  Procedure Date  . Sinus irrigation     Family History  Problem Relation Age of Onset  . Hypertension Mother   . Dementia Father   . Diabetes Brother   . Colon cancer Neg Hx     Allergies  Allergen Reactions  . Aspirin   . Ibuprofen     REACTION: sob/tightness    Current Outpatient Prescriptions on File Prior to Visit  Medication Sig Dispense Refill  . albuterol (PROVENTIL,VENTOLIN) 90 MCG/ACT inhaler Inhale 2 puffs into the lungs every 6 (six) hours as needed.        Marland Kitchen BENICAR HCT 40-25 MG per tablet TAKE 1 TABLET BY MOUTH DAILY  30 tablet  4  . fluticasone (FLONASE) 50 MCG/ACT nasal spray Place 2 sprays into the nose daily.  16 g  6  . glyBURIDE (DIABETA) 2.5 MG tablet Take 1 tablet (2.5 mg total) by mouth daily  with breakfast.  30 tablet  0  . metFORMIN (GLUMETZA) 500 MG (MOD) 24 hr tablet Take 1 tablet (500 mg total) by mouth 2 (two) times daily with a meal.  60 tablet  0  . simvastatin (ZOCOR) 40 MG tablet Take 1 tablet (40 mg total) by mouth at bedtime.  30 tablet  5     patient denies chest pain, shortness of breath, orthopnea. Denies lower extremity edema, abdominal pain, change in appetite, change in bowel movements. Patient denies rashes, musculoskeletal complaints. No other specific complaints in a complete review of systems.   Pulse 86  Temp 98.5 F (36.9 C) (Oral)  Wt 283 lb (128.368 kg)  SpO2 95%  well-developed well-nourished male in no acute distress. HEENT exam atraumatic, normocephalic, neck supple without jugular venous distention. Chest clear to auscultation cardiac exam S1-S2 are regular. Abdominal exam overweight with bowel sounds, soft and nontender. Extremities no edema. Neurologic exam is alert with a normal gait.

## 2012-02-08 ENCOUNTER — Other Ambulatory Visit: Payer: Self-pay | Admitting: *Deleted

## 2012-02-08 ENCOUNTER — Other Ambulatory Visit: Payer: Self-pay | Admitting: Internal Medicine

## 2012-02-08 DIAGNOSIS — E119 Type 2 diabetes mellitus without complications: Secondary | ICD-10-CM

## 2012-02-08 MED ORDER — METFORMIN HCL 500 MG PO TABS: 500.0000 mg | ORAL_TABLET | Freq: Two times a day (BID) | ORAL | Status: DC

## 2012-02-08 MED ORDER — METFORMIN HCL ER (MOD) 500 MG PO TB24: 500.0000 mg | ORAL_TABLET | Freq: Two times a day (BID) | ORAL | Status: DC

## 2012-02-08 NOTE — Telephone Encounter (Signed)
Pt needs refill of  metFORMIN (GLUMETZA) 500 MG (MOD) 24 hr tablet.  Pt states they went by pharm to pick up and they had not heard from [place]. I do not see where Pharm requested.  [facility]

## 2012-02-08 NOTE — Telephone Encounter (Signed)
A year supply was sent in on 12/14/11.  Resent to pharmacy electronically

## 2012-04-01 ENCOUNTER — Other Ambulatory Visit: Payer: Self-pay | Admitting: Internal Medicine

## 2012-08-03 ENCOUNTER — Other Ambulatory Visit: Payer: Self-pay

## 2012-08-04 ENCOUNTER — Encounter: Payer: Self-pay | Admitting: Gastroenterology

## 2012-10-13 ENCOUNTER — Other Ambulatory Visit: Payer: Self-pay | Admitting: Family Medicine

## 2013-01-20 ENCOUNTER — Other Ambulatory Visit: Payer: Self-pay | Admitting: Internal Medicine

## 2013-02-17 ENCOUNTER — Other Ambulatory Visit: Payer: Self-pay | Admitting: Internal Medicine

## 2013-02-17 DIAGNOSIS — E785 Hyperlipidemia, unspecified: Secondary | ICD-10-CM

## 2013-02-22 MED ORDER — SIMVASTATIN 40 MG PO TABS: 40.0000 mg | ORAL_TABLET | Freq: Every day | ORAL | Status: DC

## 2013-02-22 NOTE — Addendum Note (Signed)
Addended by: Westley Hummer B on: 02/22/2013 04:41 PM   Modules accepted: Orders

## 2013-02-22 NOTE — Telephone Encounter (Signed)
Pt at pharm now. Pt has appt sch with dr swords on 04-02-13. Pt is out of simvastatin.

## 2013-03-23 ENCOUNTER — Other Ambulatory Visit: Payer: Self-pay | Admitting: Internal Medicine

## 2013-04-02 ENCOUNTER — Ambulatory Visit: Payer: 59 | Admitting: Internal Medicine

## 2013-04-10 ENCOUNTER — Ambulatory Visit: Payer: 59 | Admitting: Internal Medicine

## 2013-04-10 ENCOUNTER — Encounter: Payer: Self-pay | Admitting: Internal Medicine

## 2013-04-10 ENCOUNTER — Ambulatory Visit (INDEPENDENT_AMBULATORY_CARE_PROVIDER_SITE_OTHER): Payer: 59 | Admitting: Internal Medicine

## 2013-04-10 VITALS — BP 130/90 | HR 95 | Temp 98.8°F | Resp 20 | Ht 73.0 in | Wt 282.0 lb

## 2013-04-10 DIAGNOSIS — E785 Hyperlipidemia, unspecified: Secondary | ICD-10-CM

## 2013-04-10 DIAGNOSIS — J45909 Unspecified asthma, uncomplicated: Secondary | ICD-10-CM

## 2013-04-10 DIAGNOSIS — I10 Essential (primary) hypertension: Secondary | ICD-10-CM

## 2013-04-10 DIAGNOSIS — E119 Type 2 diabetes mellitus without complications: Secondary | ICD-10-CM

## 2013-04-10 LAB — HEMOGLOBIN A1C: Hgb A1c MFr Bld: 13 % — ABNORMAL HIGH (ref 4.6–6.5)

## 2013-04-10 NOTE — Patient Instructions (Signed)
Limit your sodium (Salt) intake    It is important that you exercise regularly, at least 20 minutes 3 to 4 times per week.  If you develop chest pain or shortness of breath seek  medical attention.  You need to lose weight.  Consider a lower calorie diet and regular exercise.   Please check your hemoglobin A1c every 3 months   

## 2013-04-10 NOTE — Progress Notes (Signed)
   Subjective:    Patient ID: Christopher Serrano, male    DOB: Dec 19, 1955, 58 y.o.   MRN: [id number]  HPI  Wt Readings from Last 3 Encounters:  04/10/13 282 lb (127.914 kg)  12/24/11 283 lb (128.368 kg)  09/30/11 290 lb (131.543 kg)    Review of Systems     Objective:   Physical Exam        Assessment & Plan:

## 2013-04-10 NOTE — Progress Notes (Signed)
Pre-visit discussion using our clinic review tool. No additional management support is needed unless otherwise documented below in the visit note.  

## 2013-04-10 NOTE — Progress Notes (Signed)
Subjective:    Patient ID: Christopher Serrano, male    DOB: 04/10/1955, 58 y.o.   MRN: [id number]  HPI  58 year old patient who has not been seen in over one year.  He has type 2 diabetes, controlled on glyburide as well as metformin therapy.  History of hypertension, and dyslipidemia.  No cardiopulmonary complaints.  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  Asthma. Past Medical History  Diagnosis Date  . DIABETES MELLITUS, TYPE II 07/25/2006  . HYPERTENSION 07/25/2006  . ASTHMA 07/25/2006  . HYPERLIPIDEMIA 01/07/2008    History   Social History  . Marital Status: Married    Spouse Name: N/A    Number of Children: N/A  . Years of Education: N/A   Occupational History  . Not on file.   Social History Main Topics  . Smoking status: Never Smoker   . Smokeless tobacco: Not on file  . Alcohol Use: Yes     Comment: every 6 -7 mths  . Drug Use: No  . Sexual Activity: Not on file   Other Topics Concern  . Not on file   Social History Narrative  . No narrative on file    Past Surgical History  Procedure Laterality Date  . Sinus irrigation      Family History  Problem Relation Age of Onset  . Hypertension Mother   . Dementia Father   . Diabetes Brother   . Colon cancer Neg Hx     Allergies  Allergen Reactions  . Aspirin   . Ibuprofen     REACTION: sob/tightness    Current Outpatient Prescriptions on File Prior to Visit  Medication Sig Dispense Refill  . albuterol (PROVENTIL,VENTOLIN) 90 MCG/ACT inhaler Inhale 2 puffs into the lungs every 6 (six) hours as needed.        Marland Kitchen BENICAR HCT 40-25 MG per tablet TAKE 1 TABLET BY MOUTH DAILY  30 tablet  0  . fluticasone (FLONASE) 50 MCG/ACT nasal spray INSTILL 2 SPRAYS INTO THE NOSE DAILY  16 g  3  . glyBURIDE (DIABETA) 2.5 MG tablet TAKE 1 TABLET BY MOUTH DAILY WITH BREAKFAST  90 tablet  0  . metFORMIN (GLUCOPHAGE-XR) 500 MG 24 hr tablet TAKE 1 TABLET BY MOUTH TWICE DAILY WITH A MEAL  60 tablet  0  . simvastatin  (ZOCOR) 40 MG tablet Take 1 tablet (40 mg total) by mouth at bedtime.  90 tablet  0   No current facility-administered medications on file prior to visit.    BP 130/90  Pulse 95  Temp(Src) 98.8 F (37.1 C) (Oral)  Resp 20  Ht 6\' 1"  (1.854 m)  Wt 282 lb (127.914 kg)  BMI 37.21 kg/m2  SpO2 95%       Review of Systems  Constitutional: Negative for fever, chills, appetite change and fatigue.  HENT: Negative for congestion, dental problem, ear pain, hearing loss, sore throat, tinnitus, trouble swallowing and voice change.   Eyes: Negative for pain, discharge and visual disturbance.  Respiratory: Negative for cough, chest tightness, wheezing and stridor.   Cardiovascular: Negative for chest pain, palpitations and leg swelling.  Gastrointestinal: Negative for nausea, vomiting, abdominal pain, diarrhea, constipation, blood in stool and abdominal distention.  Genitourinary: Negative for urgency, hematuria, flank pain, discharge, difficulty urinating and genital sores.  Musculoskeletal: Negative for arthralgias, back pain, gait problem, joint swelling, myalgias and neck stiffness.  Skin: Negative for rash.  Neurological: Negative for dizziness, syncope, speech difficulty, weakness, numbness and headaches.  Hematological: Negative for adenopathy. Does not bruise/bleed easily.  Psychiatric/Behavioral: Negative for behavioral problems and dysphoric mood. The patient is not nervous/anxious.        Objective:   Physical Exam  Constitutional: He is oriented to person, place, and time. He appears well-developed.  HENT:  Head: Normocephalic.  Right Ear: External ear normal.  Left Ear: External ear normal.  Eyes: Conjunctivae and EOM are normal.  Neck: Normal range of motion.  Cardiovascular: Normal rate and normal heart sounds.   Pulmonary/Chest: Breath sounds normal.  Abdominal: Bowel sounds are normal.  Musculoskeletal: Normal range of motion. He exhibits no edema and no tenderness.    Neurological: He is alert and oriented to person, place, and time.  Psychiatric: He has a normal mood and affect. His behavior is normal.          Assessment & Plan:   Hypertension.  Continue present regimen.  Medications refilled Dyslipidemia.  Continue simvastatin Diabetes mellitus.  We'll check a hemoglobin A1c Exogenous obesity.  Weight loss encouraged  Schedule CPX

## 2013-04-11 ENCOUNTER — Telehealth: Payer: Self-pay | Admitting: Internal Medicine

## 2013-04-11 NOTE — Telephone Encounter (Signed)
Relevant patient education mailed to patient.  

## 2013-05-07 ENCOUNTER — Telehealth: Payer: Self-pay | Admitting: *Deleted

## 2013-05-07 MED ORDER — GLYBURIDE 2.5 MG PO TABS: ORAL_TABLET | ORAL | Status: DC

## 2013-05-07 MED ORDER — METFORMIN HCL ER 500 MG PO TB24: ORAL_TABLET | ORAL | Status: DC

## 2013-05-07 NOTE — Telephone Encounter (Signed)
ok 

## 2013-05-07 NOTE — Telephone Encounter (Signed)
Pt stopped by the office wants to go back on Glyburide 2.5 mg tablet daily. Does not like Glumetza 1000 mg daily. Please advise.

## 2013-05-07 NOTE — Telephone Encounter (Signed)
Left message on home number to call office. Rx's sent to pharmacy and Joycelyn Schmid at front desk notified.

## 2013-05-09 ENCOUNTER — Telehealth: Payer: Self-pay | Admitting: Internal Medicine

## 2013-05-09 ENCOUNTER — Encounter: Payer: Self-pay | Admitting: Internal Medicine

## 2013-05-09 NOTE — Telephone Encounter (Signed)
Pt requesting to switch PCP from [facility] to [facility].

## 2013-05-09 NOTE — Telephone Encounter (Signed)
ok 

## 2013-05-09 NOTE — Telephone Encounter (Signed)
Please schedule new patient appt with Dr Raliegh Ip

## 2013-05-16 NOTE — Telephone Encounter (Signed)
Have attempted to call pt several times, but this cell number not correct.

## 2013-07-03 ENCOUNTER — Encounter: Payer: Self-pay | Admitting: Internal Medicine

## 2013-07-03 ENCOUNTER — Ambulatory Visit (INDEPENDENT_AMBULATORY_CARE_PROVIDER_SITE_OTHER): Payer: 59 | Admitting: Internal Medicine

## 2013-07-03 ENCOUNTER — Ambulatory Visit: Payer: 59 | Admitting: Internal Medicine

## 2013-07-03 VITALS — BP 120/84 | HR 83 | Temp 98.5°F | Resp 20 | Ht 73.0 in | Wt 268.0 lb

## 2013-07-03 DIAGNOSIS — E119 Type 2 diabetes mellitus without complications: Secondary | ICD-10-CM

## 2013-07-03 DIAGNOSIS — I10 Essential (primary) hypertension: Secondary | ICD-10-CM

## 2013-07-03 MED ORDER — LORAZEPAM 0.5 MG PO TABS: 0.5000 mg | ORAL_TABLET | Freq: Two times a day (BID) | ORAL | Status: DC | PRN

## 2013-07-03 MED ORDER — ZOLPIDEM TARTRATE 10 MG PO TABS: 10.0000 mg | ORAL_TABLET | Freq: Every evening | ORAL | Status: DC | PRN

## 2013-07-03 NOTE — Progress Notes (Signed)
Pre-visit discussion using our clinic review tool. No additional management support is needed unless otherwise documented below in the visit note.  

## 2013-07-03 NOTE — Progress Notes (Signed)
Subjective:    Patient ID: Christopher Serrano, male    DOB: 1955-09-13, 58 y.o.   MRN: [id number]  HPI  58 year old patient who has history of diabetes and hypertension.  He has been undergoing considerable situational stress with  marital issues.  He has been quite anxious and sleeping poorly.  He is set up to receive counseling tomorrow afternoon at work. He has type 2 diabetes in 2 months ago.  Hemoglobin A1c was very elevated.  He states blood sugars are generally in the 110 to 1:30 range. Past Medical History  Diagnosis Date  . DIABETES MELLITUS, TYPE II 07/25/2006  . HYPERTENSION 07/25/2006  . ASTHMA 07/25/2006  . HYPERLIPIDEMIA 01/07/2008    History   Social History  . Marital Status: Married    Spouse Name: N/A    Number of Children: N/A  . Years of Education: N/A   Occupational History  . Not on file.   Social History Main Topics  . Smoking status: Never Smoker   . Smokeless tobacco: Not on file  . Alcohol Use: Yes     Comment: every 6 -7 mths  . Drug Use: No  . Sexual Activity: Not on file   Other Topics Concern  . Not on file   Social History Narrative  . No narrative on file    Past Surgical History  Procedure Laterality Date  . Sinus irrigation      Family History  Problem Relation Age of Onset  . Hypertension Mother   . Dementia Father   . Diabetes Brother   . Colon cancer Neg Hx     Allergies  Allergen Reactions  . Aspirin   . Ibuprofen     REACTION: sob/tightness    Current Outpatient Prescriptions on File Prior to Visit  Medication Sig Dispense Refill  . albuterol (PROVENTIL,VENTOLIN) 90 MCG/ACT inhaler Inhale 2 puffs into the lungs every 6 (six) hours as needed.        Marland Kitchen BENICAR HCT 40-25 MG per tablet TAKE 1 TABLET BY MOUTH DAILY  30 tablet  0  . fluticasone (FLONASE) 50 MCG/ACT nasal spray INSTILL 2 SPRAYS INTO THE NOSE DAILY  16 g  3  . glyBURIDE (DIABETA) 2.5 MG tablet TAKE 1 TABLET BY MOUTH DAILY WITH BREAKFAST  90 tablet  1  .  metFORMIN (GLUCOPHAGE-XR) 500 MG 24 hr tablet TAKE 1 TABLET BY MOUTH TWICE DAILY WITH A MEAL  180 tablet  1  . simvastatin (ZOCOR) 40 MG tablet Take 1 tablet (40 mg total) by mouth at bedtime.  90 tablet  0   No current facility-administered medications on file prior to visit.    BP 120/84  Pulse 83  Temp(Src) 98.5 F (36.9 C) (Oral)  Resp 20  Ht 6\' 1"  (1.854 m)  Wt 268 lb (121.564 kg)  BMI 35.37 kg/m2  SpO2 97%       Review of Systems  Constitutional: Negative for fever, chills, appetite change and fatigue.  HENT: Negative for congestion, dental problem, ear pain, hearing loss, sore throat, tinnitus, trouble swallowing and voice change.   Eyes: Negative for pain, discharge and visual disturbance.  Respiratory: Negative for cough, chest tightness, wheezing and stridor.   Cardiovascular: Negative for chest pain, palpitations and leg swelling.  Gastrointestinal: Negative for nausea, vomiting, abdominal pain, diarrhea, constipation, blood in stool and abdominal distention.  Genitourinary: Negative for urgency, hematuria, flank pain, discharge, difficulty urinating and genital sores.  Musculoskeletal: Negative for arthralgias, back pain, gait problem, joint  swelling, myalgias and neck stiffness.  Skin: Negative for rash.  Neurological: Negative for dizziness, syncope, speech difficulty, weakness, numbness and headaches.  Hematological: Negative for adenopathy. Does not bruise/bleed easily.  Psychiatric/Behavioral: Positive for behavioral problems and sleep disturbance. Negative for dysphoric mood. The patient is nervous/anxious.        Objective:   Physical Exam  Constitutional: He is oriented to person, place, and time. He appears well-developed.  Repeat blood pressure 120/84  HENT:  Head: Normocephalic.  Right Ear: External ear normal.  Left Ear: External ear normal.  Eyes: Conjunctivae and EOM are normal.  Neck: Normal range of motion.  Cardiovascular: Normal rate and  normal heart sounds.   Pulmonary/Chest: Breath sounds normal.  Abdominal: Bowel sounds are normal.  Musculoskeletal: Normal range of motion. He exhibits no edema and no tenderness.  Neurological: He is alert and oriented to person, place, and time.  Psychiatric: He has a normal mood and affect. His behavior is normal. Judgment and thought content normal.          Assessment & Plan:  Situational stress/insomnia.  Will treat with short term, Ambien, and anxiolytics.  Follow counseling encouraged Diabetes mellitus.  We'll check a fasting blood sugar.  Followup one month with followup hemoglobin A1c as planned

## 2013-07-03 NOTE — Patient Instructions (Signed)
Take medications as directed    It is important that you exercise regularly, at least 20 minutes 3 to 4 times per week.  If you develop chest pain or shortness of breath seek  medical attention.  Follow up with counseling as discussed

## 2013-09-03 ENCOUNTER — Telehealth: Payer: Self-pay | Admitting: *Deleted

## 2013-09-03 DIAGNOSIS — E119 Type 2 diabetes mellitus without complications: Secondary | ICD-10-CM

## 2013-09-03 DIAGNOSIS — E785 Hyperlipidemia, unspecified: Secondary | ICD-10-CM

## 2013-09-03 NOTE — Telephone Encounter (Signed)
Left message for patient to schedule a lab appointment to follow up with diabetes/cholesterol Lipid, a1c, bmet, micro albumin  Diabetic bundle

## 2014-01-22 ENCOUNTER — Ambulatory Visit (INDEPENDENT_AMBULATORY_CARE_PROVIDER_SITE_OTHER): Payer: 59 | Admitting: Internal Medicine

## 2014-01-22 ENCOUNTER — Encounter: Payer: Self-pay | Admitting: Internal Medicine

## 2014-01-22 VITALS — BP 128/90 | HR 80 | Temp 98.2°F | Wt 264.0 lb

## 2014-01-22 DIAGNOSIS — E119 Type 2 diabetes mellitus without complications: Secondary | ICD-10-CM

## 2014-01-22 DIAGNOSIS — I10 Essential (primary) hypertension: Secondary | ICD-10-CM

## 2014-01-22 DIAGNOSIS — E669 Obesity, unspecified: Secondary | ICD-10-CM | POA: Insufficient documentation

## 2014-01-22 DIAGNOSIS — E785 Hyperlipidemia, unspecified: Secondary | ICD-10-CM

## 2014-01-22 LAB — CBC WITH DIFFERENTIAL/PLATELET
Basophils Absolute: 0 10*3/uL (ref 0.0–0.1)
Basophils Relative: 0.4 % (ref 0.0–3.0)
EOS ABS: 0.2 10*3/uL (ref 0.0–0.7)
Eosinophils Relative: 1.8 % (ref 0.0–5.0)
HCT: 46.4 % (ref 39.0–52.0)
Hemoglobin: 14.6 g/dL (ref 13.0–17.0)
LYMPHS ABS: 2.7 10*3/uL (ref 0.7–4.0)
LYMPHS PCT: 31.5 % (ref 12.0–46.0)
MCHC: 31.5 g/dL (ref 30.0–36.0)
MCV: 82.9 fl (ref 78.0–100.0)
MONO ABS: 0.5 10*3/uL (ref 0.1–1.0)
Monocytes Relative: 5.8 % (ref 3.0–12.0)
NEUTROS ABS: 5.3 10*3/uL (ref 1.4–7.7)
Neutrophils Relative %: 60.5 % (ref 43.0–77.0)
PLATELETS: 258 10*3/uL (ref 150.0–400.0)
RBC: 5.59 Mil/uL (ref 4.22–5.81)
RDW: 14 % (ref 11.5–15.5)
WBC: 8.7 10*3/uL (ref 4.0–10.5)

## 2014-01-22 LAB — COMPREHENSIVE METABOLIC PANEL
ALBUMIN: 4.1 g/dL (ref 3.5–5.2)
ALT: 15 U/L (ref 0–53)
AST: 13 U/L (ref 0–37)
Alkaline Phosphatase: 54 U/L (ref 39–117)
BUN: 13 mg/dL (ref 6–23)
CALCIUM: 9.2 mg/dL (ref 8.4–10.5)
CHLORIDE: 104 meq/L (ref 96–112)
CO2: 28 mEq/L (ref 19–32)
Creatinine, Ser: 0.9 mg/dL (ref 0.4–1.5)
GFR: 111.63 mL/min (ref >60.00)
Glucose, Bld: 100 mg/dL — ABNORMAL HIGH (ref 70–99)
POTASSIUM: 4.1 meq/L (ref 3.5–5.1)
SODIUM: 140 meq/L (ref 135–145)
TOTAL PROTEIN: 8 g/dL (ref 6.0–8.3)
Total Bilirubin: 0.8 mg/dL (ref 0.2–1.2)

## 2014-01-22 LAB — TSH: TSH: 0.96 u[IU]/mL (ref 0.35–4.50)

## 2014-01-22 LAB — LIPID PANEL
Cholesterol: 180 mg/dL (ref 0–200)
HDL: 43.5 mg/dL (ref >39.00)
LDL CALC: 122 mg/dL — AB (ref 0–99)
NONHDL: 136.5
Total CHOL/HDL Ratio: 4
Triglycerides: 74 mg/dL (ref 0.0–149.0)
VLDL: 14.8 mg/dL (ref 0.0–40.0)

## 2014-01-22 LAB — HEMOGLOBIN A1C: Hgb A1c MFr Bld: 7.1 % — ABNORMAL HIGH (ref 4.6–6.5)

## 2014-01-22 MED ORDER — METFORMIN HCL 1000 MG PO TABS: 1000.0000 mg | ORAL_TABLET | Freq: Two times a day (BID) | ORAL | Status: DC

## 2014-01-22 MED ORDER — SIMVASTATIN 40 MG PO TABS: 40.0000 mg | ORAL_TABLET | Freq: Every day | ORAL | Status: DC

## 2014-01-22 MED ORDER — ZOLPIDEM TARTRATE 10 MG PO TABS: 10.0000 mg | ORAL_TABLET | Freq: Every evening | ORAL | Status: DC | PRN

## 2014-01-22 MED ORDER — LISINOPRIL-HYDROCHLOROTHIAZIDE 20-12.5 MG PO TABS: 2.0000 | ORAL_TABLET | Freq: Every day | ORAL | Status: DC

## 2014-01-22 MED ORDER — FLUTICASONE PROPIONATE 50 MCG/ACT NA SUSP: NASAL | Status: DC

## 2014-01-22 MED ORDER — ALBUTEROL SULFATE HFA 108 (90 BASE) MCG/ACT IN AERS: 2.0000 | INHALATION_SPRAY | Freq: Four times a day (QID) | RESPIRATORY_TRACT | Status: DC | PRN

## 2014-01-22 MED ORDER — GLIPIZIDE ER 5 MG PO TB24: 5.0000 mg | ORAL_TABLET | Freq: Every day | ORAL | Status: DC

## 2014-01-22 MED ORDER — LORAZEPAM 0.5 MG PO TABS: 0.5000 mg | ORAL_TABLET | Freq: Two times a day (BID) | ORAL | Status: DC | PRN

## 2014-01-22 NOTE — Progress Notes (Signed)
Pre visit review using our clinic review tool, if applicable. No additional management support is needed unless otherwise documented below in the visit note. 

## 2014-01-22 NOTE — Patient Instructions (Signed)
Please check your hemoglobin A1c every 3 months  Limit your sodium (Salt) intake    It is important that you exercise regularly, at least 20 minutes 3 to 4 times per week.  If you develop chest pain or shortness of breath seek  medical attention.  You need to lose weight.  Consider a lower calorie diet and regular exercise. 

## 2014-01-22 NOTE — Progress Notes (Signed)
Subjective:    Patient ID: Christopher Serrano, male    DOB: 1955-11-25, 58 y.o.   MRN: [id number]  HPI 58 year old patient who is seen today for follow-up of type 2 diabetes.  He states that he has had AI examination earlier this year.  He was last seen 9 months ago, and hemoglobin A1c was poorly controlled.  He has been out of his medications for about a week and a half He has treated hypertension.  He states that Benicar is costing 40 dollars per month.  No history of allergies He has history of dyslipidemia and remains on statin therapy He has stable asthma and a history of obesity No cardiopulmonary complaints  He states the blood sugars generally run between 120 and 170.  He is on submaximal dosages of both his medications .  Past Medical History  Diagnosis Date  . DIABETES MELLITUS, TYPE II 07/25/2006  . HYPERTENSION 07/25/2006  . ASTHMA 07/25/2006  . HYPERLIPIDEMIA 01/07/2008    History   Social History  . Marital Status: Married    Spouse Name: N/A    Number of Children: N/A  . Years of Education: N/A   Occupational History  . Not on file.   Social History Main Topics  . Smoking status: Never Smoker   . Smokeless tobacco: Not on file  . Alcohol Use: Yes     Comment: every 6 -7 mths  . Drug Use: No  . Sexual Activity: Not on file   Other Topics Concern  . Not on file   Social History Narrative    Past Surgical History  Procedure Laterality Date  . Sinus irrigation      Family History  Problem Relation Age of Onset  . Hypertension Mother   . Dementia Father   . Diabetes Brother   . Colon cancer Neg Hx     Allergies  Allergen Reactions  . Aspirin   . Ibuprofen     REACTION: sob/tightness    Current Outpatient Prescriptions on File Prior to Visit  Medication Sig Dispense Refill  . BENICAR HCT 40-25 MG per tablet TAKE 1 TABLET BY MOUTH DAILY 30 tablet 0  . glyBURIDE (DIABETA) 2.5 MG tablet TAKE 1 TABLET BY MOUTH DAILY WITH BREAKFAST 90 tablet 1  .  LORazepam (ATIVAN) 0.5 MG tablet Take 1 tablet (0.5 mg total) by mouth 2 (two) times daily as needed for anxiety. 60 tablet 0  . metFORMIN (GLUCOPHAGE-XR) 500 MG 24 hr tablet TAKE 1 TABLET BY MOUTH TWICE DAILY WITH A MEAL 180 tablet 1  . simvastatin (ZOCOR) 40 MG tablet Take 1 tablet (40 mg total) by mouth at bedtime. 90 tablet 0  . albuterol (PROVENTIL,VENTOLIN) 90 MCG/ACT inhaler Inhale 2 puffs into the lungs every 6 (six) hours as needed.      . fluticasone (FLONASE) 50 MCG/ACT nasal spray INSTILL 2 SPRAYS INTO THE NOSE DAILY (Patient not taking: Reported on 01/22/2014) 16 g 3  . zolpidem (AMBIEN) 10 MG tablet Take 1 tablet (10 mg total) by mouth at bedtime as needed for sleep. 30 tablet 0   No current facility-administered medications on file prior to visit.    BP 128/90 mmHg  Pulse 80  Temp(Src) 98.2 F (36.8 C) (Oral)  Wt 264 lb (119.75 kg)  SpO2 96%      Review of Systems  Constitutional: Negative for fever, chills, appetite change and fatigue.  HENT: Negative for congestion, dental problem, ear pain, hearing loss, sore throat, tinnitus, trouble swallowing and  voice change.   Eyes: Negative for pain, discharge and visual disturbance.  Respiratory: Negative for cough, chest tightness, wheezing and stridor.   Cardiovascular: Negative for chest pain, palpitations and leg swelling.  Gastrointestinal: Negative for nausea, vomiting, abdominal pain, diarrhea, constipation, blood in stool and abdominal distention.  Genitourinary: Negative for urgency, hematuria, flank pain, discharge, difficulty urinating and genital sores.  Musculoskeletal: Negative for myalgias, back pain, joint swelling, arthralgias, gait problem and neck stiffness.  Skin: Negative for rash.  Neurological: Negative for dizziness, syncope, speech difficulty, weakness, numbness and headaches.  Hematological: Negative for adenopathy. Does not bruise/bleed easily.  Psychiatric/Behavioral: Negative for behavioral  problems and dysphoric mood. The patient is not nervous/anxious.        Objective:   Physical Exam  Constitutional: He is oriented to person, place, and time. He appears well-developed.  HENT:  Head: Normocephalic.  Right Ear: External ear normal.  Left Ear: External ear normal.  Eyes: Conjunctivae and EOM are normal.  Neck: Normal range of motion.  Cardiovascular: Normal rate and normal heart sounds.   Pulmonary/Chest: Breath sounds normal.  Abdominal: Bowel sounds are normal.  Musculoskeletal: Normal range of motion. He exhibits no edema or tenderness.  Neurological: He is alert and oriented to person, place, and time.  Psychiatric: He has a normal mood and affect. His behavior is normal.          Assessment & Plan:   Diabetes mellitus.  Will check a hemoglobin A1c, urine for microalbumin Dyslipidemia.  Will check a lipid profile Hypertension.  We'll switch to a generic combination product Obesity.  Weight loss encouraged  Schedule CPX

## 2014-03-08 ENCOUNTER — Encounter: Payer: Self-pay | Admitting: Internal Medicine

## 2014-03-08 ENCOUNTER — Ambulatory Visit (INDEPENDENT_AMBULATORY_CARE_PROVIDER_SITE_OTHER): Payer: 59 | Admitting: Internal Medicine

## 2014-03-08 VITALS — BP 130/86 | HR 93 | Temp 98.1°F | Resp 20 | Ht 73.0 in | Wt 269.0 lb

## 2014-03-08 DIAGNOSIS — J4520 Mild intermittent asthma, uncomplicated: Secondary | ICD-10-CM

## 2014-03-08 DIAGNOSIS — B9789 Other viral agents as the cause of diseases classified elsewhere: Secondary | ICD-10-CM

## 2014-03-08 DIAGNOSIS — E119 Type 2 diabetes mellitus without complications: Secondary | ICD-10-CM

## 2014-03-08 DIAGNOSIS — J069 Acute upper respiratory infection, unspecified: Secondary | ICD-10-CM

## 2014-03-08 MED ORDER — LORAZEPAM 0.5 MG PO TABS: 0.5000 mg | ORAL_TABLET | Freq: Two times a day (BID) | ORAL | Status: DC | PRN

## 2014-03-08 MED ORDER — ZOLPIDEM TARTRATE 10 MG PO TABS
10.0000 mg | ORAL_TABLET | Freq: Every evening | ORAL | Status: DC | PRN
Start: 2014-03-08 — End: 2015-03-11

## 2014-03-08 MED ORDER — PREDNISONE 20 MG PO TABS: 20.0000 mg | ORAL_TABLET | Freq: Two times a day (BID) | ORAL | Status: DC

## 2014-03-08 NOTE — Progress Notes (Signed)
Subjective:    Patient ID: Christopher Serrano, male    DOB: 07-29-1955, 59 y.o.   MRN: [id number]  HPI  59 year old patient who has a history of type 2 diabetes, hypertension and asthma.  For the past 4 days he has had sinus congestion, cough, and general sense of unwellness.  He took a vacation date 2 days ago due to weakness, but otherwise has not missed any work this week.  There's been no wheezing.  He has hoarseness, sinus congestion.  His blood pressure and blood sugars have remained normal.  His last hemoglobin A1c was greatly improved to 7.1.  Maintenance medication includes fluticasone.  He has not required any albuterol use this week.  He states that he has had some difficulty due to situational stress from marital difficulties. Past Medical History  Diagnosis Date  . DIABETES MELLITUS, TYPE II 07/25/2006  . HYPERTENSION 07/25/2006  . ASTHMA 07/25/2006  . HYPERLIPIDEMIA 01/07/2008    History   Social History  . Marital Status: Married    Spouse Name: N/A  . Number of Children: N/A  . Years of Education: N/A   Occupational History  . Not on file.   Social History Main Topics  . Smoking status: Never Smoker   . Smokeless tobacco: Not on file  . Alcohol Use: Yes     Comment: every 6 -7 mths  . Drug Use: No  . Sexual Activity: Not on file   Other Topics Concern  . Not on file   Social History Narrative    Past Surgical History  Procedure Laterality Date  . Sinus irrigation      Family History  Problem Relation Age of Onset  . Hypertension Mother   . Dementia Father   . Diabetes Brother   . Colon cancer Neg Hx     Allergies  Allergen Reactions  . Aspirin   . Ibuprofen     REACTION: sob/tightness    Current Outpatient Prescriptions on File Prior to Visit  Medication Sig Dispense Refill  . albuterol (PROVENTIL HFA;VENTOLIN HFA) 108 (90 BASE) MCG/ACT inhaler Inhale 2 puffs into the lungs every 6 (six) hours as needed for wheezing or shortness of breath. 1  Inhaler 5  . albuterol (PROVENTIL,VENTOLIN) 90 MCG/ACT inhaler Inhale 2 puffs into the lungs every 6 (six) hours as needed.      . fluticasone (FLONASE) 50 MCG/ACT nasal spray INSTILL 2 SPRAYS INTO THE NOSE DAILY 16 g 5  . glipiZIDE (GLUCOTROL XL) 5 MG 24 hr tablet Take 1 tablet (5 mg total) by mouth daily with breakfast. 90 tablet 4  . lisinopril-hydrochlorothiazide (PRINZIDE,ZESTORETIC) 20-12.5 MG per tablet Take 2 tablets by mouth daily. 180 tablet 3  . LORazepam (ATIVAN) 0.5 MG tablet Take 1 tablet (0.5 mg total) by mouth 2 (two) times daily as needed for anxiety. 60 tablet 5  . metFORMIN (GLUCOPHAGE) 1000 MG tablet Take 1 tablet (1,000 mg total) by mouth 2 (two) times daily with a meal. 180 tablet 3  . simvastatin (ZOCOR) 40 MG tablet Take 1 tablet (40 mg total) by mouth at bedtime. 90 tablet 1  . zolpidem (AMBIEN) 10 MG tablet Take 1 tablet (10 mg total) by mouth at bedtime as needed for sleep. 30 tablet 5   No current facility-administered medications on file prior to visit.    BP 130/86 mmHg  Pulse 93  Temp(Src) 98.1 F (36.7 C) (Oral)  Resp 20  Ht 6\' 1"  (1.854 m)  Wt 269 lb (122.018 kg)  BMI 35.50 kg/m2  SpO2 98%      Review of Systems  Constitutional: Positive for activity change, appetite change and fatigue. Negative for fever and chills.  HENT: Positive for congestion, rhinorrhea, sinus pressure and voice change. Negative for dental problem, ear pain, hearing loss, sore throat, tinnitus and trouble swallowing.   Eyes: Negative for pain, discharge and visual disturbance.  Respiratory: Positive for cough. Negative for chest tightness, wheezing and stridor.   Cardiovascular: Negative for chest pain, palpitations and leg swelling.  Gastrointestinal: Negative for nausea, vomiting, abdominal pain, diarrhea, constipation, blood in stool and abdominal distention.  Genitourinary: Negative for urgency, hematuria, flank pain, discharge, difficulty urinating and genital sores.    Musculoskeletal: Negative for myalgias, back pain, joint swelling, arthralgias, gait problem and neck stiffness.  Skin: Negative for rash.  Neurological: Negative for dizziness, syncope, speech difficulty, weakness, numbness and headaches.  Hematological: Negative for adenopathy. Does not bruise/bleed easily.  Psychiatric/Behavioral: Negative for behavioral problems and dysphoric mood. The patient is not nervous/anxious.        Objective:   Physical Exam  Constitutional: He is oriented to person, place, and time. He appears well-developed and well-nourished. No distress.  Hoarse Afebrile  HENT:  Head: Normocephalic.  Right Ear: External ear normal.  Left Ear: External ear normal.  Eyes: Conjunctivae and EOM are normal.  Neck: Normal range of motion.  Cardiovascular: Normal rate and normal heart sounds.   Pulmonary/Chest: Effort normal and breath sounds normal. No respiratory distress. He has no wheezes. He has no rales.  Abdominal: Bowel sounds are normal.  Musculoskeletal: Normal range of motion. He exhibits no edema or tenderness.  Neurological: He is alert and oriented to person, place, and time.  Psychiatric: He has a normal mood and affect. His behavior is normal.          Assessment & Plan:  URI with nasal congestion, hoarseness and cough.  Will treat symptomatically Situational stress.  Will refill Ambien Hypertension, stable Type 2 diabetes

## 2014-03-08 NOTE — Patient Instructions (Signed)
Acute sinusitis symptoms for less than 10 days are generally not helped by antibiotic therapy.  Use saline irrigation, warm  moist compresses and over-the-counter decongestants only as directed.  Call if there is no improvement in 5 to 7 days, or sooner if you develop increasing pain, fever, or any new symptoms.  HOME CARE INSTRUCTIONS  Drink plenty of water. Water helps thin the mucus so your sinuses can drain more easily.  Use a humidifier.  Inhale steam 3 to 4 times a day (for example, sit in the bathroom with the shower running).  Apply a warm, moist washcloth to your face 3 to 4 times a day, or as directed by your health care provider.  Use saline nasal sprays to help moisten and clean your sinuses.  Take medicines only as directed by your health care provider.   SEEK IMMEDIATE MEDICAL CARE IF:  You have increasing pain or severe headaches.  You have nausea, vomiting, or drowsiness.  You have swelling around your face.  You have vision problems.  You have a stiff neck.  You have difficulty breathing.

## 2014-03-08 NOTE — Progress Notes (Signed)
Pre visit review using our clinic review tool, if applicable. No additional management support is needed unless otherwise documented below in the visit note. 

## 2014-05-30 ENCOUNTER — Telehealth: Payer: Self-pay | Admitting: *Deleted

## 2014-05-30 NOTE — Telephone Encounter (Signed)
Pt at work, message given to Joycelyn Schmid (pt's mother in-law) Use saline irrigation, warm moist compresses and over-the-counter decongestants only as directed. Call if there is no improvement in 5 to 7 days, or sooner if you develop increasing pain, fever, or any new symptoms. Suggest Mucinex D 1 twice daily per Dr. Cyril Mourning verbalized understanding and will give pt message.

## 2014-05-30 NOTE — Telephone Encounter (Signed)
Dr. Raliegh Ip, pt c/o nasal congestion, sinus pressure and would like something called into pharmacy for him. Please advise.

## 2014-05-30 NOTE — Telephone Encounter (Signed)
Left message on voicemail to call office.  

## 2014-05-30 NOTE — Telephone Encounter (Signed)
Use saline irrigation, warm  moist compresses and over-the-counter decongestants only as directed.  Call if there is no improvement in 5 to 7 days, or sooner if you develop increasing pain, fever, or any new symptoms.  Suggest Mucinex D 1 twice daily

## 2014-07-13 ENCOUNTER — Other Ambulatory Visit: Payer: Self-pay | Admitting: Family Medicine

## 2014-11-08 ENCOUNTER — Encounter: Payer: Self-pay | Admitting: Internal Medicine

## 2014-11-08 ENCOUNTER — Ambulatory Visit (INDEPENDENT_AMBULATORY_CARE_PROVIDER_SITE_OTHER): Payer: 59 | Admitting: Internal Medicine

## 2014-11-08 VITALS — BP 136/98 | HR 74 | Temp 98.3°F | Ht 73.0 in | Wt 280.0 lb

## 2014-11-08 DIAGNOSIS — I10 Essential (primary) hypertension: Secondary | ICD-10-CM

## 2014-11-08 DIAGNOSIS — E119 Type 2 diabetes mellitus without complications: Secondary | ICD-10-CM

## 2014-11-08 DIAGNOSIS — E785 Hyperlipidemia, unspecified: Secondary | ICD-10-CM | POA: Diagnosis not present

## 2014-11-08 MED ORDER — PREDNISONE 20 MG PO TABS: 20.0000 mg | ORAL_TABLET | Freq: Two times a day (BID) | ORAL | Status: DC

## 2014-11-08 MED ORDER — PREDNISONE 20 MG PO TABS: 20.0000 mg | ORAL_TABLET | Freq: Every day | ORAL | Status: DC

## 2014-11-08 NOTE — Progress Notes (Signed)
Pre visit review using our clinic review tool, if applicable. No additional management support is needed unless otherwise documented below in the visit note. 

## 2014-11-08 NOTE — Patient Instructions (Signed)
Use saline irrigation, warm  moist compresses and over-the-counter decongestants only as directed.  Call if there is no improvement in 5 to 7 days, or sooner if you develop increasing pain, fever, or any new symptoms.  HOME CARE INSTRUCTIONS  Drink plenty of water. Water helps thin the mucus so your sinuses can drain more easily.  Use a humidifier.  Inhale steam 3-4 times a day (for example, sit in the bathroom with the shower running).  Apply a warm, moist washcloth to your face 3-4 times a day, or as directed by your health care provider.  Use saline nasal sprays to help moisten and clean your sinuses.   Return in 3 months for your annual exam

## 2014-11-08 NOTE — Progress Notes (Signed)
Subjective:    Patient ID: Christopher Serrano, male    DOB: [birth year], 59 y.o.   MRN: [id number]  HPI  Lab Results  Component Value Date   HGBA1C 7.1* 01/22/2014    59 year old patient who has a history of allergic rhinitis and asthma.  The past 3 or 4 weeks he has had some headaches that are worse in the morning.  For the past 2 weeks he has had a decreased sensation of smell.  He is had increasing sinus congestion.  No fever or purulent drainage. He has type 2 diabetes.  He has dyslipidemia and a history of hypertension.  Past Medical History  Diagnosis Date  . DIABETES MELLITUS, TYPE II 07/25/2006  . HYPERTENSION 07/25/2006  . ASTHMA 07/25/2006  . HYPERLIPIDEMIA 01/07/2008    Social History   Social History  . Marital Status: Married    Spouse Name: N/A  . Number of Children: N/A  . Years of Education: N/A   Occupational History  . Not on file.   Social History Main Topics  . Smoking status: Never Smoker   . Smokeless tobacco: Not on file  . Alcohol Use: Yes     Comment: every 6 -7 mths  . Drug Use: No  . Sexual Activity: Not on file   Other Topics Concern  . Not on file   Social History Narrative    Past Surgical History  Procedure Laterality Date  . Sinus irrigation      Family History  Problem Relation Age of Onset  . Hypertension Mother   . Dementia Father   . Diabetes Brother   . Colon cancer Neg Hx     Allergies  Allergen Reactions  . Aspirin   . Ibuprofen     REACTION: sob/tightness    Current Outpatient Prescriptions on File Prior to Visit  Medication Sig Dispense Refill  . albuterol (PROVENTIL HFA;VENTOLIN HFA) 108 (90 BASE) MCG/ACT inhaler Inhale 2 puffs into the lungs every 6 (six) hours as needed for wheezing or shortness of breath. 1 Inhaler 5  . fluticasone (FLONASE) 50 MCG/ACT nasal spray INSTILL 2 SPRAYS INTO THE NOSE DAILY 16 g 5  . glipiZIDE (GLUCOTROL XL) 5 MG 24 hr tablet Take 1 tablet (5 mg total) by mouth daily with breakfast.  90 tablet 4  . lisinopril-hydrochlorothiazide (PRINZIDE,ZESTORETIC) 20-12.5 MG per tablet Take 2 tablets by mouth daily. 180 tablet 3  . LORazepam (ATIVAN) 0.5 MG tablet Take 1 tablet (0.5 mg total) by mouth 2 (two) times daily as needed for anxiety. 60 tablet 5  . metFORMIN (GLUCOPHAGE) 1000 MG tablet Take 1 tablet (1,000 mg total) by mouth 2 (two) times daily with a meal. 180 tablet 3  . predniSONE (DELTASONE) 20 MG tablet Take 1 tablet (20 mg total) by mouth 2 (two) times daily with a meal. 12 tablet 0  . simvastatin (ZOCOR) 40 MG tablet Take 1 tablet (40 mg total) by mouth at bedtime. 90 tablet 1  . zolpidem (AMBIEN) 10 MG tablet Take 1 tablet (10 mg total) by mouth at bedtime as needed for sleep. 30 tablet 5   No current facility-administered medications on file prior to visit.    BP 136/98 mmHg  Pulse 74  Temp(Src) 98.3 F (36.8 C) (Oral)  Ht 6\' 1"  (1.854 m)  Wt 280 lb (127.007 kg)  BMI 36.95 kg/m2      Review of Systems  Constitutional: Negative for fever, chills, appetite change and fatigue.  HENT: Positive for congestion and  sinus pressure. Negative for dental problem, ear pain, hearing loss, sore throat, tinnitus, trouble swallowing and voice change.   Eyes: Negative for pain, discharge and visual disturbance.  Respiratory: Negative for cough, chest tightness, wheezing and stridor.   Cardiovascular: Negative for chest pain, palpitations and leg swelling.  Gastrointestinal: Negative for nausea, vomiting, abdominal pain, diarrhea, constipation, blood in stool and abdominal distention.  Genitourinary: Negative for urgency, hematuria, flank pain, discharge, difficulty urinating and genital sores.  Musculoskeletal: Negative for myalgias, back pain, joint swelling, arthralgias, gait problem and neck stiffness.  Skin: Negative for rash.  Neurological: Positive for headaches. Negative for dizziness, syncope, speech difficulty, weakness and numbness.  Hematological: Negative for  adenopathy. Does not bruise/bleed easily.  Psychiatric/Behavioral: Negative for behavioral problems and dysphoric mood. The patient is not nervous/anxious.        Objective:   Physical Exam  Constitutional: He is oriented to person, place, and time. He appears well-developed.  Blood pressure 135/85 Congested  HENT:  Head: Normocephalic.  Right Ear: External ear normal.  Left Ear: External ear normal.  Pharyngeal crowding  Eyes: Conjunctivae and EOM are normal.  Neck: Normal range of motion.  Cardiovascular: Normal rate and normal heart sounds.   Pulmonary/Chest: Breath sounds normal.  Abdominal: Bowel sounds are normal.  Musculoskeletal: Normal range of motion. He exhibits no edema or tenderness.  Neurological: He is alert and oriented to person, place, and time.  Psychiatric: He has a normal mood and affect. His behavior is normal.          Assessment & Plan:  Allergic rhinitis.  Will treat with prednisone for 5 days and continue nasal steroids.  Will treat with decongestants and the saline irrigation Asthma, stable Hypertension Diabetes.  Check hemoglobin A1c and urine for microalbumin  Preventive health.  Schedule CPX

## 2015-01-25 ENCOUNTER — Other Ambulatory Visit: Payer: Self-pay | Admitting: Internal Medicine

## 2015-02-03 ENCOUNTER — Other Ambulatory Visit: Payer: 59

## 2015-02-10 ENCOUNTER — Encounter: Payer: 59 | Admitting: Internal Medicine

## 2015-02-24 ENCOUNTER — Other Ambulatory Visit: Payer: 59

## 2015-03-03 ENCOUNTER — Encounter: Payer: 59 | Admitting: Internal Medicine

## 2015-03-03 ENCOUNTER — Other Ambulatory Visit: Payer: 59

## 2015-03-11 ENCOUNTER — Ambulatory Visit (INDEPENDENT_AMBULATORY_CARE_PROVIDER_SITE_OTHER): Payer: 59 | Admitting: Internal Medicine

## 2015-03-11 ENCOUNTER — Other Ambulatory Visit: Payer: Self-pay | Admitting: *Deleted

## 2015-03-11 ENCOUNTER — Encounter: Payer: Self-pay | Admitting: Internal Medicine

## 2015-03-11 VITALS — BP 110/78 | HR 97 | Temp 99.0°F | Resp 20 | Ht 72.0 in | Wt 268.0 lb

## 2015-03-11 DIAGNOSIS — Z86010 Personal history of colonic polyps: Secondary | ICD-10-CM | POA: Diagnosis not present

## 2015-03-11 DIAGNOSIS — Z0000 Encounter for general adult medical examination without abnormal findings: Secondary | ICD-10-CM | POA: Diagnosis not present

## 2015-03-11 DIAGNOSIS — Z1159 Encounter for screening for other viral diseases: Secondary | ICD-10-CM | POA: Diagnosis not present

## 2015-03-11 DIAGNOSIS — E785 Hyperlipidemia, unspecified: Secondary | ICD-10-CM

## 2015-03-11 LAB — LIPID PANEL
CHOLESTEROL: 160 mg/dL (ref 0–200)
HDL: 44.7 mg/dL (ref >39.00)
LDL CALC: 96 mg/dL (ref 0–99)
NonHDL: 115.25
Total CHOL/HDL Ratio: 4
Triglycerides: 95 mg/dL (ref 0.0–149.0)
VLDL: 19 mg/dL (ref 0.0–40.0)

## 2015-03-11 LAB — HEPATIC FUNCTION PANEL
ALT: 16 U/L (ref 0–53)
AST: 18 U/L (ref 0–37)
Albumin: 4.1 g/dL (ref 3.5–5.2)
Alkaline Phosphatase: 43 U/L (ref 39–117)
BILIRUBIN TOTAL: 0.7 mg/dL (ref 0.2–1.2)
Bilirubin, Direct: 0.1 mg/dL (ref 0.0–0.3)
Total Protein: 7.9 g/dL (ref 6.0–8.3)

## 2015-03-11 LAB — POCT URINALYSIS DIPSTICK
Bilirubin, UA: NEGATIVE
GLUCOSE UA: NEGATIVE
Ketones, UA: NEGATIVE
Leukocytes, UA: NEGATIVE
Nitrite, UA: NEGATIVE
PH UA: 5
PROTEIN UA: NEGATIVE
RBC UA: NEGATIVE
UROBILINOGEN UA: 0.2

## 2015-03-11 LAB — BASIC METABOLIC PANEL
BUN: 15 mg/dL (ref 6–23)
CHLORIDE: 100 meq/L (ref 96–112)
CO2: 29 mEq/L (ref 19–32)
Calcium: 9.7 mg/dL (ref 8.4–10.5)
Creatinine, Ser: 0.89 mg/dL (ref 0.40–1.50)
GFR: 112.64 mL/min (ref >60.00)
GLUCOSE: 90 mg/dL (ref 70–99)
POTASSIUM: 4.6 meq/L (ref 3.5–5.1)
Sodium: 139 mEq/L (ref 135–145)

## 2015-03-11 LAB — CBC WITH DIFFERENTIAL/PLATELET
BASOS PCT: 0.7 % (ref 0.0–3.0)
Basophils Absolute: 0.1 10*3/uL (ref 0.0–0.1)
EOS PCT: 1.8 % (ref 0.0–5.0)
Eosinophils Absolute: 0.2 10*3/uL (ref 0.0–0.7)
HCT: 46 % (ref 39.0–52.0)
Hemoglobin: 15.1 g/dL (ref 13.0–17.0)
LYMPHS ABS: 2.7 10*3/uL (ref 0.7–4.0)
Lymphocytes Relative: 26.4 % (ref 12.0–46.0)
MCHC: 32.8 g/dL (ref 30.0–36.0)
MCV: 81.9 fl (ref 78.0–100.0)
MONO ABS: 0.7 10*3/uL (ref 0.1–1.0)
Monocytes Relative: 7.1 % (ref 3.0–12.0)
NEUTROS PCT: 64 % (ref 43.0–77.0)
Neutro Abs: 6.5 10*3/uL (ref 1.4–7.7)
Platelets: 333 10*3/uL (ref 150.0–400.0)
RBC: 5.62 Mil/uL (ref 4.22–5.81)
RDW: 13.5 % (ref 11.5–15.5)
WBC: 10.1 10*3/uL (ref 4.0–10.5)

## 2015-03-11 LAB — MICROALBUMIN / CREATININE URINE RATIO
CREATININE, U: 298.1 mg/dL
MICROALB UR: 3.9 mg/dL — AB (ref 0.0–1.9)
Microalb Creat Ratio: 1.3 mg/g (ref 0.0–30.0)

## 2015-03-11 LAB — TSH: TSH: 0.77 u[IU]/mL (ref 0.35–4.50)

## 2015-03-11 LAB — HEMOGLOBIN A1C: Hgb A1c MFr Bld: 7.9 % — ABNORMAL HIGH (ref 4.6–6.5)

## 2015-03-11 LAB — PSA: PSA: 0.72 ng/mL (ref 0.10–4.00)

## 2015-03-11 MED ORDER — SIMVASTATIN 40 MG PO TABS: 40.0000 mg | ORAL_TABLET | Freq: Every day | ORAL | Status: DC

## 2015-03-11 MED ORDER — GLIPIZIDE ER 5 MG PO TB24: 5.0000 mg | ORAL_TABLET | Freq: Every day | ORAL | Status: DC

## 2015-03-11 MED ORDER — METFORMIN HCL 1000 MG PO TABS: 1000.0000 mg | ORAL_TABLET | Freq: Two times a day (BID) | ORAL | Status: DC

## 2015-03-11 MED ORDER — LISINOPRIL-HYDROCHLOROTHIAZIDE 20-12.5 MG PO TABS: 2.0000 | ORAL_TABLET | Freq: Every day | ORAL | Status: DC

## 2015-03-11 NOTE — Progress Notes (Signed)
Pre visit review using our clinic review tool, if applicable. No additional management support is needed unless otherwise documented below in the visit note. 

## 2015-03-11 NOTE — Patient Instructions (Signed)
Please check your hemoglobin A1c every 3-6  Months  Limit your sodium (Salt) intake    It is important that you exercise regularly, at least 20 minutes 3 to 4 times per week.  If you develop chest pain or shortness of breath seek  medical attention.  Please see your eye doctor yearly to check for diabetic eye damage  Schedule your colonoscopy to help detect colon cancer.

## 2015-03-11 NOTE — Progress Notes (Signed)
Subjective:    Patient ID: Christopher Serrano, male    DOB: Mar 15, 1955, 60 y.o.   MRN: [id number]  HPI  Lab Results  Component Value Date   HGBA1C 7.1* 01/22/2014   60 year old patient who is seen today for a preventive health examination.  He has type 2 diabetes which has been managed with the oral medications.  He has made a successful effort at weight loss with better dietary habits as well as frequent walks He has asthma, which has been stable. He had colonoscopy in 2010 but has not had follow-up colonoscopy.  He does have a history of colonic polyps He has dyslipidemia and treated hypertension  Past Medical History  Diagnosis Date  . DIABETES MELLITUS, TYPE II 07/25/2006  . HYPERTENSION 07/25/2006  . ASTHMA 07/25/2006  . HYPERLIPIDEMIA 01/07/2008    Social History   Social History  . Marital Status: Married    Spouse Name: N/A  . Number of Children: N/A  . Years of Education: N/A   Occupational History  . Not on file.   Social History Main Topics  . Smoking status: Never Smoker   . Smokeless tobacco: Not on file  . Alcohol Use: Yes     Comment: every 6 -7 mths  . Drug Use: No  . Sexual Activity: Not on file   Other Topics Concern  . Not on file   Social History Narrative    Past Surgical History  Procedure Laterality Date  . Sinus irrigation      Family History  Problem Relation Age of Onset  . Hypertension Mother   . Dementia Father   . Diabetes Brother   . Colon cancer Neg Hx     Allergies  Allergen Reactions  . Aspirin   . Ibuprofen     REACTION: sob/tightness    Current Outpatient Prescriptions on File Prior to Visit  Medication Sig Dispense Refill  . fluticasone (FLONASE) 50 MCG/ACT nasal spray INSTILL 2 SPRAYS INTO THE NOSE DAILY 16 g 5   No current facility-administered medications on file prior to visit.    BP 110/78 mmHg  Pulse 97  Temp(Src) 99 F (37.2 C) (Oral)  Resp 20  Ht 6' (1.829 m)  Wt 268 lb (121.564 kg)  BMI 36.34 kg/m2   SpO2 97%       Review of Systems  Constitutional: Negative for fever, chills, appetite change and fatigue.  HENT: Negative for congestion, dental problem, ear pain, hearing loss, sore throat, tinnitus, trouble swallowing and voice change.   Eyes: Negative for pain, discharge and visual disturbance.  Respiratory: Negative for cough, chest tightness, wheezing and stridor.   Cardiovascular: Negative for chest pain, palpitations and leg swelling.  Gastrointestinal: Negative for nausea, vomiting, abdominal pain, diarrhea, constipation, blood in stool and abdominal distention.  Genitourinary: Negative for urgency, hematuria, flank pain, discharge, difficulty urinating and genital sores.  Musculoskeletal: Negative for myalgias, back pain, joint swelling, arthralgias, gait problem and neck stiffness.  Skin: Negative for rash.  Neurological: Negative for dizziness, syncope, speech difficulty, weakness, numbness and headaches.  Hematological: Negative for adenopathy. Does not bruise/bleed easily.  Psychiatric/Behavioral: Negative for behavioral problems and dysphoric mood. The patient is not nervous/anxious.        Objective:   Physical Exam  Constitutional: He appears well-developed and well-nourished.  HENT:  Head: Normocephalic and atraumatic.  Right Ear: External ear normal.  Left Ear: External ear normal.  Nose: Nose normal.  Mouth/Throat: Oropharynx is clear and moist.  Eyes: Conjunctivae  and EOM are normal. Pupils are equal, round, and reactive to light. No scleral icterus.  Neck: Normal range of motion. Neck supple. No JVD present. No thyromegaly present.  Cardiovascular: Regular rhythm, normal heart sounds and intact distal pulses.  Exam reveals no gallop and no friction rub.   No murmur heard. Pulmonary/Chest: Effort normal and breath sounds normal. He exhibits no tenderness.  Abdominal: Soft. Bowel sounds are normal. He exhibits no distension and no mass. There is no  tenderness.  Genitourinary: Prostate normal and penis normal. Guaiac negative stool.  Musculoskeletal: Normal range of motion. He exhibits no edema or tenderness.  Lymphadenopathy:    He has no cervical adenopathy.  Neurological: He is alert. He has normal reflexes. No cranial nerve deficit. Coordination normal.  Skin: Skin is warm and dry. No rash noted.  Psychiatric: He has a normal mood and affect. His behavior is normal.          Assessment & Plan:  Preventive health exam History colonic polyps.  Will schedule follow-up colonoscopy Type 2 diabetes.  Will check hemoglobin A1c, urine for microalbumin Dyslipidemia.  Will check a lipid profile Essential hypertension, controlled Obesity.  Continue efforts at weight loss and exercise  Recheck 4-6 months

## 2015-03-12 LAB — HEPATITIS C ANTIBODY: HCV AB: NEGATIVE

## 2015-03-12 LAB — HIV ANTIBODY (ROUTINE TESTING W REFLEX): HIV: NONREACTIVE

## 2016-01-20 ENCOUNTER — Other Ambulatory Visit (INDEPENDENT_AMBULATORY_CARE_PROVIDER_SITE_OTHER): Payer: 59

## 2016-01-20 DIAGNOSIS — Z0000 Encounter for general adult medical examination without abnormal findings: Secondary | ICD-10-CM | POA: Diagnosis not present

## 2016-01-20 LAB — CBC WITH DIFFERENTIAL/PLATELET
Basophils Absolute: 0 10*3/uL (ref 0.0–0.1)
Basophils Relative: 0.4 % (ref 0.0–3.0)
EOS ABS: 0.4 10*3/uL (ref 0.0–0.7)
EOS PCT: 3 % (ref 0.0–5.0)
HCT: 47.4 % (ref 39.0–52.0)
HEMOGLOBIN: 15.8 g/dL (ref 13.0–17.0)
LYMPHS ABS: 2.5 10*3/uL (ref 0.7–4.0)
Lymphocytes Relative: 20.4 % (ref 12.0–46.0)
MCHC: 33.3 g/dL (ref 30.0–36.0)
MCV: 83.6 fl (ref 78.0–100.0)
MONO ABS: 0.7 10*3/uL (ref 0.1–1.0)
Monocytes Relative: 5.8 % (ref 3.0–12.0)
NEUTROS PCT: 70.4 % (ref 43.0–77.0)
Neutro Abs: 8.7 10*3/uL — ABNORMAL HIGH (ref 1.4–7.7)
Platelets: 247 10*3/uL (ref 150.0–400.0)
RBC: 5.67 Mil/uL (ref 4.22–5.81)
RDW: 13.3 % (ref 11.5–15.5)
WBC: 12.4 10*3/uL — AB (ref 4.0–10.5)

## 2016-01-20 LAB — LIPID PANEL
CHOLESTEROL: 149 mg/dL (ref 0–200)
HDL: 45.1 mg/dL (ref >39.00)
LDL Cholesterol: 86 mg/dL (ref 0–99)
NonHDL: 103.53
Total CHOL/HDL Ratio: 3
Triglycerides: 90 mg/dL (ref 0.0–149.0)
VLDL: 18 mg/dL (ref 0.0–40.0)

## 2016-01-20 LAB — POC URINALSYSI DIPSTICK (AUTOMATED)
GLUCOSE UA: NEGATIVE
LEUKOCYTES UA: NEGATIVE
NITRITE UA: NEGATIVE
RBC UA: NEGATIVE
Spec Grav, UA: 1.02
UROBILINOGEN UA: 0.2
pH, UA: 5

## 2016-01-20 LAB — BASIC METABOLIC PANEL
BUN: 13 mg/dL (ref 6–23)
CALCIUM: 9.8 mg/dL (ref 8.4–10.5)
CO2: 27 mEq/L (ref 19–32)
CREATININE: 0.92 mg/dL (ref 0.40–1.50)
Chloride: 100 mEq/L (ref 96–112)
GFR: 108.09 mL/min (ref >60.00)
GLUCOSE: 165 mg/dL — AB (ref 70–99)
POTASSIUM: 4.2 meq/L (ref 3.5–5.1)
Sodium: 140 mEq/L (ref 135–145)

## 2016-01-20 LAB — MICROALBUMIN / CREATININE URINE RATIO
CREATININE, U: 305.2 mg/dL
MICROALB UR: 48 mg/dL — AB (ref 0.0–1.9)
MICROALB/CREAT RATIO: 15.7 mg/g (ref 0.0–30.0)

## 2016-01-20 LAB — HEPATIC FUNCTION PANEL
ALBUMIN: 4.4 g/dL (ref 3.5–5.2)
ALT: 15 U/L (ref 0–53)
AST: 13 U/L (ref 0–37)
Alkaline Phosphatase: 60 U/L (ref 39–117)
BILIRUBIN TOTAL: 1 mg/dL (ref 0.2–1.2)
Bilirubin, Direct: 0.2 mg/dL (ref 0.0–0.3)
Total Protein: 7.8 g/dL (ref 6.0–8.3)

## 2016-01-20 LAB — PSA: PSA: 0.56 ng/mL (ref 0.10–4.00)

## 2016-01-20 LAB — TSH: TSH: 0.98 u[IU]/mL (ref 0.35–4.50)

## 2016-01-20 LAB — HEMOGLOBIN A1C: HEMOGLOBIN A1C: 8.1 % — AB (ref 4.6–6.5)

## 2016-01-27 ENCOUNTER — Telehealth: Payer: Self-pay | Admitting: *Deleted

## 2016-01-27 ENCOUNTER — Ambulatory Visit (INDEPENDENT_AMBULATORY_CARE_PROVIDER_SITE_OTHER): Payer: 59 | Admitting: Internal Medicine

## 2016-01-27 ENCOUNTER — Encounter: Payer: Self-pay | Admitting: Internal Medicine

## 2016-01-27 VITALS — BP 142/78 | HR 113 | Temp 98.5°F | Ht 72.0 in | Wt 281.5 lb

## 2016-01-27 DIAGNOSIS — Z0000 Encounter for general adult medical examination without abnormal findings: Secondary | ICD-10-CM | POA: Diagnosis not present

## 2016-01-27 MED ORDER — CANAGLIFLOZIN 300 MG PO TABS: 300.0000 mg | ORAL_TABLET | Freq: Every day | ORAL | 6 refills | Status: DC

## 2016-01-27 MED ORDER — ZOLPIDEM TARTRATE 10 MG PO TABS: 10.0000 mg | ORAL_TABLET | Freq: Every evening | ORAL | 1 refills | Status: DC | PRN

## 2016-01-27 NOTE — Progress Notes (Signed)
Pre visit review using our clinic review tool, if applicable. No additional management support is needed unless otherwise documented below in the visit note. 

## 2016-01-27 NOTE — Telephone Encounter (Signed)
Please call pt and schedule follow up appointment per Dr.K.

## 2016-01-27 NOTE — Patient Instructions (Signed)
Schedule your colonoscopy to help detect colon cancer.  Examination today revealed hidden blood in the stool.  Colonoscopy is essential to rule out colon cancer   Please check your hemoglobin A1c every 3 months  Limit your sodium (Salt) intake    It is important that you exercise regularly, at least 20 minutes 3 to 4 times per week.  If you develop chest pain or shortness of breath seek  medical attention.  You need to lose weight.  Consider a lower calorie diet and regular exercise.  Please see your eye doctor yearly to check for diabetic eye damage

## 2016-01-27 NOTE — Progress Notes (Signed)
Subjective:    Patient ID: Christopher Serrano, male    DOB: 08/10/1955, 61 y.o.   MRN: [id number]  HPI  61 year old patient who is seen today for a preventive health examination. He has multiple medical issues, but has not been seen for some time He has type 2 diabetes and recent hemoglobin A1c 8.1.  He has been compliant with his medications  Lab Results  Component Value Date   HGBA1C 8.1 (H) 01/20/2016    Wt Readings from Last 3 Encounters:  01/27/16 281 lb 8 oz (127.7 kg)  03/11/15 268 lb (121.6 kg)  11/08/14 280 lb (127 kg)   He has a history of colonic polyps but has not had follow-up colonoscopy.  Stool today was hematest positive.  The importance of diagnostic colonoscopy discussed  Otherwise, doing well.  No recent eye examination, but he plans on having this done next month  Past Medical History:  Diagnosis Date  . ASTHMA 07/25/2006  . DIABETES MELLITUS, TYPE II 07/25/2006  . HYPERLIPIDEMIA 01/07/2008  . HYPERTENSION 07/25/2006     Social History   Social History  . Marital status: Married    Spouse name: N/A  . Number of children: N/A  . Years of education: N/A   Occupational History  . Not on file.   Social History Main Topics  . Smoking status: Never Smoker  . Smokeless tobacco: Not on file  . Alcohol use Yes     Comment: every 6 -7 mths  . Drug use: No  . Sexual activity: Not on file   Other Topics Concern  . Not on file   Social History Narrative  . No narrative on file    Past Surgical History:  Procedure Laterality Date  . SINUS IRRIGATION      Family History  Problem Relation Age of Onset  . Hypertension Mother   . Dementia Father   . Diabetes Brother   . Colon cancer Neg Hx     Allergies  Allergen Reactions  . Aspirin   . Ibuprofen     REACTION: sob/tightness    Current Outpatient Prescriptions on File Prior to Visit  Medication Sig Dispense Refill  . fluticasone (FLONASE) 50 MCG/ACT nasal spray INSTILL 2 SPRAYS INTO THE NOSE  DAILY 16 g 5  . glipiZIDE (GLUCOTROL XL) 5 MG 24 hr tablet Take 1 tablet (5 mg total) by mouth daily with breakfast. 90 tablet 3  . lisinopril-hydrochlorothiazide (PRINZIDE,ZESTORETIC) 20-12.5 MG tablet Take 2 tablets by mouth daily. 180 tablet 3  . metFORMIN (GLUCOPHAGE) 1000 MG tablet Take 1 tablet (1,000 mg total) by mouth 2 (two) times daily with a meal. 180 tablet 3  . simvastatin (ZOCOR) 40 MG tablet Take 1 tablet (40 mg total) by mouth at bedtime. 90 tablet 3   No current facility-administered medications on file prior to visit.     BP (!) 142/78 (BP Location: Right Arm, Patient Position: Sitting, Cuff Size: Large)   Pulse (!) 113   Temp 98.5 F (36.9 C) (Oral)   Ht 6' (1.829 m)   Wt 281 lb 8 oz (127.7 kg)   SpO2 97%   BMI 38.18 kg/m    Review of Systems  Constitutional: Negative for activity change, appetite change, chills, fatigue and fever.  HENT: Negative for congestion, dental problem, ear pain, hearing loss, mouth sores, rhinorrhea, sinus pressure, sneezing, tinnitus, trouble swallowing and voice change.   Eyes: Negative for photophobia, pain, redness and visual disturbance.  Respiratory: Negative for apnea, cough,  choking, chest tightness, shortness of breath and wheezing.   Cardiovascular: Negative for chest pain, palpitations and leg swelling.  Gastrointestinal: Negative for abdominal distention, abdominal pain, anal bleeding, blood in stool, constipation, diarrhea, nausea, rectal pain and vomiting.  Genitourinary: Negative for decreased urine volume, difficulty urinating, discharge, dysuria, flank pain, frequency, genital sores, hematuria, penile swelling, scrotal swelling, testicular pain and urgency.  Musculoskeletal: Negative for arthralgias, back pain, gait problem, joint swelling, myalgias, neck pain and neck stiffness.  Skin: Negative for color change, rash and wound.  Neurological: Negative for dizziness, tremors, seizures, syncope, facial asymmetry, speech  difficulty, weakness, light-headedness, numbness and headaches.  Hematological: Negative for adenopathy. Does not bruise/bleed easily.  Psychiatric/Behavioral: Negative for agitation, behavioral problems, confusion, decreased concentration, dysphoric mood, hallucinations, self-injury, sleep disturbance and suicidal ideas. The patient is not nervous/anxious.        Objective:   Physical Exam  Constitutional: He appears well-developed and well-nourished.  Weight 281 Blood pressure 130/80 Pulse rate 95  HENT:  Head: Normocephalic and atraumatic.  Right Ear: External ear normal.  Left Ear: External ear normal.  Nose: Nose normal.  Mouth/Throat: Oropharynx is clear and moist.  Wax left canal  Eyes: Conjunctivae and EOM are normal. Pupils are equal, round, and reactive to light. No scleral icterus.  Neck: Normal range of motion. Neck supple. No JVD present. No thyromegaly present.  Cardiovascular: Regular rhythm, normal heart sounds and intact distal pulses.  Exam reveals no gallop and no friction rub.   No murmur heard. Pulmonary/Chest: Effort normal and breath sounds normal. He exhibits no tenderness.  Abdominal: Soft. Bowel sounds are normal. He exhibits no distension and no mass. There is no tenderness.  Genitourinary: Prostate normal and penis normal. Rectal exam shows guaiac positive stool.  Musculoskeletal: Normal range of motion. He exhibits no edema or tenderness.  Lymphadenopathy:    He has no cervical adenopathy.  Neurological: He is alert. He has normal reflexes. No cranial nerve deficit. Coordination normal.  Skin: Skin is warm and dry. No rash noted.  Psychiatric: He has a normal mood and affect. His behavior is normal.          Assessment & Plan:   Preventive health examination. Colonic polyps/heme positive stool.  The need for diagnostic colonoscopy discussed and encouraged.  This will be scheduled Diabetes.  Suboptimal control.  Weight loss encouraged.  Will place  on SGLT 2 blocker Hypertension, stable Exogenous obesity Dyslipidemia.  Continue statin therapy  Eye examination encouraged Diagnostic colonoscopy  Follow-up 3 months  [email or web address],PETER Pilar Plate

## 2016-02-09 ENCOUNTER — Other Ambulatory Visit: Payer: Self-pay | Admitting: Internal Medicine

## 2016-02-09 ENCOUNTER — Telehealth: Payer: Self-pay | Admitting: Internal Medicine

## 2016-02-09 MED ORDER — PIOGLITAZONE HCL 45 MG PO TABS: 45.0000 mg | ORAL_TABLET | Freq: Every day | ORAL | 0 refills | Status: DC

## 2016-02-09 NOTE — Telephone Encounter (Signed)
Spoke with pt and he was prescribed Invokana last week at his office visit. The medication is too expensive for pt could he have another medication. Please advise.

## 2016-02-09 NOTE — Telephone Encounter (Signed)
Please call in a new prescription for generic Actos 45 mg #90 one daily Please have patient price both [place] and [place]

## 2016-02-09 NOTE — Telephone Encounter (Signed)
New prescription for generic Actos 45 mg one daily was called in to pt's pharmacy. Pt informed. Also will have patient price both Jardiance and Wilder Glade, will be expecting a call back from pt stating which medication is more cost effective.

## 2016-02-27 ENCOUNTER — Encounter: Payer: Self-pay | Admitting: Internal Medicine

## 2016-04-18 ENCOUNTER — Other Ambulatory Visit: Payer: Self-pay | Admitting: Internal Medicine

## 2016-06-08 ENCOUNTER — Encounter: Payer: Self-pay | Admitting: Internal Medicine

## 2016-06-08 ENCOUNTER — Ambulatory Visit (INDEPENDENT_AMBULATORY_CARE_PROVIDER_SITE_OTHER): Payer: 59 | Admitting: Internal Medicine

## 2016-06-08 VITALS — BP 118/72 | HR 108 | Temp 98.4°F | Ht 72.0 in | Wt 279.4 lb

## 2016-06-08 DIAGNOSIS — I10 Essential (primary) hypertension: Secondary | ICD-10-CM

## 2016-06-08 DIAGNOSIS — Z86010 Personal history of colonic polyps: Secondary | ICD-10-CM

## 2016-06-08 DIAGNOSIS — E785 Hyperlipidemia, unspecified: Secondary | ICD-10-CM

## 2016-06-08 DIAGNOSIS — E119 Type 2 diabetes mellitus without complications: Secondary | ICD-10-CM | POA: Diagnosis not present

## 2016-06-08 LAB — POCT GLYCOSYLATED HEMOGLOBIN (HGB A1C): Hemoglobin A1C: 7

## 2016-06-08 MED ORDER — DAPAGLIFLOZIN PROPANEDIOL 10 MG PO TABS: 10.0000 mg | ORAL_TABLET | Freq: Every day | ORAL | 4 refills | Status: DC

## 2016-06-08 MED ORDER — SIMVASTATIN 40 MG PO TABS: 40.0000 mg | ORAL_TABLET | Freq: Every day | ORAL | 3 refills | Status: DC

## 2016-06-08 MED ORDER — GLIPIZIDE ER 5 MG PO TB24: ORAL_TABLET | ORAL | 1 refills | Status: DC

## 2016-06-08 MED ORDER — LISINOPRIL-HYDROCHLOROTHIAZIDE 20-12.5 MG PO TABS: 2.0000 | ORAL_TABLET | Freq: Every day | ORAL | 3 refills | Status: DC

## 2016-06-08 MED ORDER — PIOGLITAZONE HCL 45 MG PO TABS: 45.0000 mg | ORAL_TABLET | Freq: Every day | ORAL | 1 refills | Status: DC

## 2016-06-08 MED ORDER — ZOLPIDEM TARTRATE 10 MG PO TABS: 10.0000 mg | ORAL_TABLET | Freq: Every evening | ORAL | 1 refills | Status: DC | PRN

## 2016-06-08 MED ORDER — METFORMIN HCL 1000 MG PO TABS: 1000.0000 mg | ORAL_TABLET | Freq: Two times a day (BID) | ORAL | 3 refills | Status: DC

## 2016-06-08 NOTE — Patient Instructions (Addendum)
WE NOW OFFER   [facility] FAST TRACK!!!  SAME DAY Appointments for ACUTE CARE  Such as: Sprains, Injuries, cuts, abrasions, rashes, muscle pain, joint pain, back pain Colds, flu, sore throats, headache, allergies, cough, fever  Ear pain, sinus and eye infections Abdominal pain, nausea, vomiting, diarrhea, upset stomach Animal/insect bites  3 Easy Ways to Schedule: Walk-In Scheduling Call in scheduling Mychart Sign-up: [email or web address]  Limit your sodium (Salt) intake  You need to lose weight.  Consider a lower calorie diet and regular exercise.   Please check your hemoglobin A1c every 3-6  Months  Schedule your colonoscopy to help detect colon cancer.  Consider adding a nonsedating antihistamine such as Allegra or Zyrtec once daily Continue fluticasone nasal spray  Airway

## 2016-06-08 NOTE — Progress Notes (Signed)
Subjective:    Patient ID: Christopher Serrano, male    DOB: 25-Jun-1955, 61 y.o.   MRN: [id number]  HPI  Lab Results  Component Value Date   HGBA1C 8.1 (H) 01/20/2016    Wt Readings from Last 3 Encounters:  06/08/16 279 lb 6.4 oz (126.7 kg)  01/27/16 281 lb 8 oz (127.7 kg)  03/11/15 268 lb (121.6 kg)    BP Readings from Last 3 Encounters:  06/08/16 118/72  01/27/16 (!) 142/78  03/11/15 41/50   61 year old patient who is seen today for follow-up of type 2 diabetes.  3 months ago he was placed on Farxiga , which he took for 2 months but has not refilled.  He states blood sugars generally run from 120-160.  He checks blood sugars about every other day.  There is been some modest weight loss over the past 4 months  No follow-up colonoscopy but he has had a follow-up eye examination He generally feels well except for some mild allergy related symptoms  Past Medical History:  Diagnosis Date  . ASTHMA 07/25/2006  . DIABETES MELLITUS, TYPE II 07/25/2006  . HYPERLIPIDEMIA 01/07/2008  . HYPERTENSION 07/25/2006     Social History   Social History  . Marital status: Married    Spouse name: N/A  . Number of children: N/A  . Years of education: N/A   Occupational History  . Not on file.   Social History Main Topics  . Smoking status: Never Smoker  . Smokeless tobacco: Never Used  . Alcohol use Yes     Comment: every 6 -7 mths  . Drug use: No  . Sexual activity: Not on file   Other Topics Concern  . Not on file   Social History Narrative  . No narrative on file    Past Surgical History:  Procedure Laterality Date  . SINUS IRRIGATION      Family History  Problem Relation Age of Onset  . Hypertension Mother   . Dementia Father   . Diabetes Brother   . Colon cancer Neg Hx     Allergies  Allergen Reactions  . Aspirin   . Ibuprofen     REACTION: sob/tightness    Current Outpatient Prescriptions on File Prior to Visit  Medication Sig Dispense Refill  . glipiZIDE  (GLUCOTROL XL) 5 MG 24 hr tablet TAKE 1 TABLET(5 MG) BY MOUTH DAILY WITH BREAKFAST 90 tablet 0  . lisinopril-hydrochlorothiazide (PRINZIDE,ZESTORETIC) 20-12.5 MG tablet Take 2 tablets by mouth daily. 180 tablet 3  . metFORMIN (GLUCOPHAGE) 1000 MG tablet Take 1 tablet (1,000 mg total) by mouth 2 (two) times daily with a meal. 180 tablet 3  . pioglitazone (ACTOS) 45 MG tablet Take 1 tablet (45 mg total) by mouth daily. 45 tablet 0  . simvastatin (ZOCOR) 40 MG tablet Take 1 tablet (40 mg total) by mouth at bedtime. 90 tablet 3  . fluticasone (FLONASE) 50 MCG/ACT nasal spray INSTILL 2 SPRAYS INTO THE NOSE DAILY (Patient not taking: Reported on 06/08/2016) 16 g 5  . zolpidem (AMBIEN) 10 MG tablet Take 1 tablet (10 mg total) by mouth at bedtime as needed for sleep. 15 tablet 1   No current facility-administered medications on file prior to visit.     BP 118/72 (BP Location: Left Arm, Patient Position: Sitting, Cuff Size: Large)   Pulse (!) 108   Temp 98.4 F (36.9 C) (Oral)   Ht 6' (1.829 m)   Wt 279 lb 6.4 oz (126.7 kg)  SpO2 98%   BMI 37.89 kg/m     Review of Systems  Constitutional: Negative for appetite change, chills, fatigue and fever.  HENT: Positive for congestion. Negative for dental problem, ear pain, hearing loss, sore throat, tinnitus, trouble swallowing and voice change.   Eyes: Negative for pain, discharge and visual disturbance.  Respiratory: Negative for cough, chest tightness, wheezing and stridor.   Cardiovascular: Negative for chest pain, palpitations and leg swelling.  Gastrointestinal: Negative for abdominal distention, abdominal pain, blood in stool, constipation, diarrhea, nausea and vomiting.  Genitourinary: Negative for difficulty urinating, discharge, flank pain, genital sores, hematuria and urgency.  Musculoskeletal: Negative for arthralgias, back pain, gait problem, joint swelling, myalgias and neck stiffness.  Skin: Negative for rash.  Neurological: Negative  for dizziness, syncope, speech difficulty, weakness, numbness and headaches.  Hematological: Negative for adenopathy. Does not bruise/bleed easily.  Psychiatric/Behavioral: Negative for behavioral problems and dysphoric mood. The patient is not nervous/anxious.        Objective:   Physical Exam  Constitutional: He is oriented to person, place, and time. He appears well-developed.  Weight 279 Blood pressure 110/70  HENT:  Head: Normocephalic.  Right Ear: External ear normal.  Left Ear: External ear normal.  Eyes: Conjunctivae and EOM are normal.  Neck: Normal range of motion.  Cardiovascular: Normal rate and normal heart sounds.   Pulmonary/Chest: Breath sounds normal.  Abdominal: Bowel sounds are normal.  Musculoskeletal: Normal range of motion. He exhibits no edema or tenderness.  Neurological: He is alert and oriented to person, place, and time.  Psychiatric: He has a normal mood and affect. His behavior is normal.          Assessment & Plan:   Diabetes mellitus.  Will check hemoglobin A1c.  Resume Farxiga History colonic polyps.  Schedule follow-up colonoscopy Hypertension, well-controlled  Recheck 4 months  Nishaan Stanke Pilar Plate

## 2016-08-31 ENCOUNTER — Encounter: Payer: Self-pay | Admitting: Internal Medicine

## 2016-10-18 ENCOUNTER — Encounter: Payer: Self-pay | Admitting: Gastroenterology

## 2016-11-26 ENCOUNTER — Ambulatory Visit (AMBULATORY_SURGERY_CENTER): Payer: Self-pay

## 2016-11-26 VITALS — Ht 73.0 in | Wt 270.0 lb

## 2016-11-26 DIAGNOSIS — Z86010 Personal history of colonic polyps: Secondary | ICD-10-CM

## 2016-11-26 MED ORDER — NA SULFATE-K SULFATE-MG SULF 17.5-3.13-1.6 GM/177ML PO SOLN: 1.0000 | Freq: Once | ORAL | 0 refills | Status: AC

## 2016-11-26 NOTE — Progress Notes (Signed)
Denies allergies to eggs or soy products. Denies complication of anesthesia or sedation. Denies use of weight loss medication. Denies use of O2.   Emmi instructions declined.  

## 2016-12-01 ENCOUNTER — Encounter: Payer: Self-pay | Admitting: Gastroenterology

## 2016-12-10 ENCOUNTER — Other Ambulatory Visit: Payer: Self-pay

## 2016-12-10 ENCOUNTER — Encounter: Payer: Self-pay | Admitting: Gastroenterology

## 2016-12-10 ENCOUNTER — Ambulatory Visit (AMBULATORY_SURGERY_CENTER): Payer: 59 | Admitting: Gastroenterology

## 2016-12-10 VITALS — BP 105/79 | HR 94 | Temp 97.3°F | Resp 16 | Ht 73.0 in | Wt 270.0 lb

## 2016-12-10 DIAGNOSIS — D122 Benign neoplasm of ascending colon: Secondary | ICD-10-CM | POA: Diagnosis not present

## 2016-12-10 DIAGNOSIS — D124 Benign neoplasm of descending colon: Secondary | ICD-10-CM

## 2016-12-10 DIAGNOSIS — Z86010 Personal history of colonic polyps: Secondary | ICD-10-CM

## 2016-12-10 DIAGNOSIS — D123 Benign neoplasm of transverse colon: Secondary | ICD-10-CM

## 2016-12-10 DIAGNOSIS — K5730 Diverticulosis of large intestine without perforation or abscess without bleeding: Secondary | ICD-10-CM

## 2016-12-10 MED ORDER — SODIUM CHLORIDE 0.9 % IV SOLN
500.0000 mL | INTRAVENOUS | Status: DC
Start: 2016-12-10 — End: 2016-12-10

## 2016-12-10 NOTE — Progress Notes (Signed)
To PACU, VSS. Report to [email or web address] 

## 2016-12-10 NOTE — Progress Notes (Signed)
Called to room to assist during endoscopic procedure.  Patient ID and intended procedure confirmed with present staff. Received instructions for my participation in the procedure from the performing physician.  

## 2016-12-10 NOTE — Patient Instructions (Signed)
Impression/Recommendations:  Polyp handout given to patient. Diverticulosis handout given to patient.  Resume previous diet. Continue present medications.  Repeat colonoscopy recommended.  Date to be determined after pathology results reviewed.  YOU HAD AN ENDOSCOPIC PROCEDURE TODAY AT THE [facility]:   Refer to the procedure report that was given to you for any specific questions about what was found during the examination.  If the procedure report does not answer your questions, please call your gastroenterologist to clarify.  If you requested that your care partner not be given the details of your procedure findings, then the procedure report has been included in a sealed envelope for you to review at your convenience later.  YOU SHOULD EXPECT: Some feelings of bloating in the abdomen. Passage of more gas than usual.  Walking can help get rid of the air that was put into your GI tract during the procedure and reduce the bloating. If you had a lower endoscopy (such as a colonoscopy or flexible sigmoidoscopy) you may notice spotting of blood in your stool or on the toilet paper. If you underwent a bowel prep for your procedure, you may not have a normal bowel movement for a few days.  Please Note:  You might notice some irritation and congestion in your nose or some drainage.  This is from the oxygen used during your procedure.  There is no need for concern and it should clear up in a day or so.  SYMPTOMS TO REPORT IMMEDIATELY:   Following lower endoscopy (colonoscopy or flexible sigmoidoscopy):  Excessive amounts of blood in the stool  Significant tenderness or worsening of abdominal pains  Swelling of the abdomen that is new, acute  Fever of 100F or higher For urgent or emergent issues, a gastroenterologist can be reached at any hour by calling [phone].   DIET:  We do recommend a small meal at first, but then you may proceed to your regular diet.  Drink plenty of  fluids but you should avoid alcoholic beverages for 24 hours.  ACTIVITY:  You should plan to take it easy for the rest of today and you should NOT DRIVE or use heavy machinery until tomorrow (because of the sedation medicines used during the test).    FOLLOW UP: Our staff will call the number listed on your records the next business day following your procedure to check on you and address any questions or concerns that you may have regarding the information given to you following your procedure. If we do not reach you, we will leave a message.  However, if you are feeling well and you are not experiencing any problems, there is no need to return our call.  We will assume that you have returned to your regular daily activities without incident.  If any biopsies were taken you will be contacted by phone or by letter within the next 1-3 weeks.  Please call us at [phone] if you have not heard about the biopsies in 3 weeks.    SIGNATURES/CONFIDENTIALITY: You and/or your care partner have signed paperwork which will be entered into your electronic medical record.  These signatures attest to the fact that that the information above on your After Visit Summary has been reviewed and is understood.  Full responsibility of the confidentiality of this discharge information lies with you and/or your care-partner. 

## 2016-12-10 NOTE — Op Note (Signed)
[place] Patient Name: Christopher Serrano Procedure Date: 12/10/2016 10:23 AM MRN: [id number] Endoscopist: Milus Banister , MD Age: 61 Referring MD:  Date of Birth: 1955-11-13 Gender: Male Account #: [id number] Procedure:                Colonoscopy Indications:              High risk colon cancer surveillance: Personal                            history of colonic polyps; 2 polyps removed 2010,                            one was a 1.2cm TVA Medicines:                Monitored Anesthesia Care Procedure:                Pre-Anesthesia Assessment:                           - Prior to the procedure, a History and Physical                            was performed, and patient medications and                            allergies were reviewed. The patient's tolerance of                            previous anesthesia was also reviewed. The risks                            and benefits of the procedure and the sedation                            options and risks were discussed with the patient.                            All questions were answered, and informed consent                            was obtained. Prior Anticoagulants: The patient has                            taken no previous anticoagulant or antiplatelet                            agents. ASA Grade Assessment: II - A patient with                            mild systemic disease. After reviewing the risks                            and benefits, the patient was deemed in  satisfactory condition to undergo the procedure.                           After obtaining informed consent, the colonoscope                            was passed under direct vision. Throughout the                            procedure, the patient's blood pressure, pulse, and                            oxygen saturations were monitored continuously. The                            Colonoscope was introduced through the anus  and                            advanced to the the cecum, identified by                            appendiceal orifice and ileocecal valve. The                            colonoscopy was performed without difficulty. The                            patient tolerated the procedure well. The quality                            of the bowel preparation was excellent. The                            ileocecal valve, appendiceal orifice, and rectum                            were photographed. Scope In: 10:32:06 AM Scope Out: 10:46:07 AM Scope Withdrawal Time: 0 hours 11 minutes 38 seconds  Total Procedure Duration: 0 hours 14 minutes 1 second  Findings:                 Two sessile polyps were found in the descending                            colon and ascending colon. The polyps were 2 to 5                            mm in size. These polyps were removed with a cold                            snare. Resection and retrieval were complete.                           Multiple small and large-mouthed diverticula were  found in the entire colon.                           The exam was otherwise without abnormality on                            direct and retroflexion views. Complications:            No immediate complications. Estimated blood loss:                            None. Estimated Blood Loss:     Estimated blood loss: none. Impression:               - Two 2 to 5 mm polyps in the descending colon and                            in the ascending colon, removed with a cold snare.                            Resected and retrieved.                           - Diverticulosis in the entire examined colon.                           - The examination was otherwise normal on direct                            and retroflexion views. Recommendation:           - Patient has a contact number available for                            emergencies. The signs and symptoms of  potential                            delayed complications were discussed with the                            patient. Return to normal activities tomorrow.                            Written discharge instructions were provided to the                            patient.                           - Resume previous diet.                           - Continue present medications.                           You will receive a letter within 2-3 weeks with the  pathology results and my final recommendations.                           If the polyp(s) is proven to be 'pre-cancerous' on                            pathology, you will need repeat colonoscopy in 5                            years. Milus Banister, MD 12/10/2016 10:48:44 AM This report has been signed electronically.

## 2016-12-13 ENCOUNTER — Telehealth: Payer: Self-pay | Admitting: *Deleted

## 2016-12-13 NOTE — Telephone Encounter (Signed)
  Follow up Call-  Call back number 12/10/2016  Post procedure Call Back phone  # [phone]  Permission to leave phone message Yes  Some recent data might be hidden     Patient questions:  Do you have a fever, pain , or abdominal swelling? No. Pain Score  0 *  Have you tolerated food without any problems? Yes.    Have you been able to return to your normal activities? Yes.    Do you have any questions about your discharge instructions: Diet   No. Medications  No. Follow up visit  No.  Do you have questions or concerns about your Care? No.  Actions: * If pain score is 4 or above: No action needed, pain <4.

## 2016-12-15 ENCOUNTER — Encounter: Payer: Self-pay | Admitting: Gastroenterology

## 2016-12-18 ENCOUNTER — Other Ambulatory Visit: Payer: Self-pay | Admitting: Internal Medicine

## 2017-03-22 ENCOUNTER — Other Ambulatory Visit: Payer: Self-pay | Admitting: Internal Medicine

## 2017-03-23 NOTE — Telephone Encounter (Signed)
Due for office visit. Called patient and left message to return call to schedule follow-up.

## 2017-04-18 ENCOUNTER — Ambulatory Visit (INDEPENDENT_AMBULATORY_CARE_PROVIDER_SITE_OTHER): Payer: 59 | Admitting: Internal Medicine

## 2017-04-18 ENCOUNTER — Encounter: Payer: Self-pay | Admitting: Internal Medicine

## 2017-04-18 VITALS — BP 118/80 | HR 97 | Temp 98.6°F | Wt 262.0 lb

## 2017-04-18 DIAGNOSIS — E119 Type 2 diabetes mellitus without complications: Secondary | ICD-10-CM | POA: Diagnosis not present

## 2017-04-18 DIAGNOSIS — E785 Hyperlipidemia, unspecified: Secondary | ICD-10-CM | POA: Diagnosis not present

## 2017-04-18 DIAGNOSIS — I10 Essential (primary) hypertension: Secondary | ICD-10-CM

## 2017-04-18 MED ORDER — SEMAGLUTIDE(0.25 OR 0.5MG/DOS) 2 MG/1.5ML ~~LOC~~ SOPN: 0.5000 mg | PEN_INJECTOR | SUBCUTANEOUS | 6 refills | Status: DC

## 2017-04-18 NOTE — Progress Notes (Signed)
Subjective:    Patient ID: Christopher Serrano, male    DOB: 09/12/1955, 62 y.o.   MRN: [id number]  HPI  62 years old who is seen today for follow-up of type 2 diabetes.  Hemoglobin A1c 10 months ago was 7.0.  He has been eating much better and there has been a nice weight loss over this period of time.  He still consumes 2 soft drinks per day but in general eating much healthier.  He feels well.  Fasting blood sugars are generally in the 150 range  Hemoglobin A1c today 9.2  No postprandial blood sugars documented  Past Medical History:  Diagnosis Date  . Allergy   . ASTHMA 07/25/2006  . Depression   . DIABETES MELLITUS, TYPE II 07/25/2006  . HYPERLIPIDEMIA 01/07/2008  . HYPERTENSION 07/25/2006     Social History   Socioeconomic History  . Marital status: Married    Spouse name: Not on file  . Number of children: Not on file  . Years of education: Not on file  . Highest education level: Not on file  Occupational History  . Not on file  Social Needs  . Financial resource strain: Not on file  . Food insecurity:    Worry: Not on file    Inability: Not on file  . Transportation needs:    Medical: Not on file    Non-medical: Not on file  Tobacco Use  . Smoking status: Never Smoker  . Smokeless tobacco: Never Used  Substance and Sexual Activity  . Alcohol use: Yes    Comment: every 6 -7 mths  . Drug use: No  . Sexual activity: Not on file  Lifestyle  . Physical activity:    Days per week: Not on file    Minutes per session: Not on file  . Stress: Not on file  Relationships  . Social connections:    Talks on phone: Not on file    Gets together: Not on file    Attends religious service: Not on file    Active member of club or organization: Not on file    Attends meetings of clubs or organizations: Not on file    Relationship status: Not on file  . Intimate partner violence:    Fear of current or ex partner: Not on file    Emotionally abused: Not on file    Physically  abused: Not on file    Forced sexual activity: Not on file  Other Topics Concern  . Not on file  Social History Narrative  . Not on file    Past Surgical History:  Procedure Laterality Date  . SINUS IRRIGATION      Family History  Problem Relation Age of Onset  . Hypertension Mother   . Dementia Father   . Diabetes Brother   . Colon cancer Neg Hx   . Esophageal cancer Neg Hx   . Pancreatic cancer Neg Hx   . Rectal cancer Neg Hx   . Stomach cancer Neg Hx     Allergies  Allergen Reactions  . Aspirin   . Ibuprofen     REACTION: sob/tightness    Current Outpatient Medications on File Prior to Visit  Medication Sig Dispense Refill  . dapagliflozin propanediol (FARXIGA) 10 MG TABS tablet Take 10 mg by mouth daily. 90 tablet 4  . glipiZIDE (GLUCOTROL XL) 5 MG 24 hr tablet TAKE 1 TABLET(5 MG) BY MOUTH DAILY WITH BREAKFAST 90 tablet 0  . lisinopril-hydrochlorothiazide (PRINZIDE,ZESTORETIC) 20-12.5 MG  tablet Take 2 tablets by mouth daily. 180 tablet 3  . metFORMIN (GLUCOPHAGE) 1000 MG tablet Take 1 tablet (1,000 mg total) by mouth 2 (two) times daily with a meal. 180 tablet 3  . simvastatin (ZOCOR) 40 MG tablet Take 1 tablet (40 mg total) by mouth at bedtime. 90 tablet 3  . zolpidem (AMBIEN) 10 MG tablet Take 1 tablet (10 mg total) by mouth at bedtime as needed for sleep. 15 tablet 1   No current facility-administered medications on file prior to visit.     BP 118/80 (BP Location: Right Arm, Patient Position: Sitting, Cuff Size: Large)   Pulse 97   Temp 98.6 F (37 C) (Oral)   Wt 262 lb (118.8 kg)   SpO2 99%   BMI 34.57 kg/m     Review of Systems  Constitutional: Negative for appetite change, chills, fatigue and fever.  HENT: Negative for congestion, dental problem, ear pain, hearing loss, sore throat, tinnitus, trouble swallowing and voice change.   Eyes: Negative for pain, discharge and visual disturbance.  Respiratory: Negative for cough, chest tightness, wheezing  and stridor.   Cardiovascular: Negative for chest pain, palpitations and leg swelling.  Gastrointestinal: Negative for abdominal distention, abdominal pain, blood in stool, constipation, diarrhea, nausea and vomiting.  Genitourinary: Negative for difficulty urinating, discharge, flank pain, genital sores, hematuria and urgency.  Musculoskeletal: Negative for arthralgias, back pain, gait problem, joint swelling, myalgias and neck stiffness.  Skin: Negative for rash.  Neurological: Negative for dizziness, syncope, speech difficulty, weakness, numbness and headaches.  Hematological: Negative for adenopathy. Does not bruise/bleed easily.  Psychiatric/Behavioral: Negative for behavioral problems and dysphoric mood. The patient is not nervous/anxious.        Objective:   Physical Exam  Constitutional: He is oriented to person, place, and time. He appears well-developed.  HENT:  Head: Normocephalic.  Right Ear: External ear normal.  Left Ear: External ear normal.  Eyes: Conjunctivae and EOM are normal.  Neck: Normal range of motion.  Cardiovascular: Normal rate and normal heart sounds.  Pulmonary/Chest: Breath sounds normal.  Abdominal: Bowel sounds are normal.  Musculoskeletal: Normal range of motion. He exhibits no edema or tenderness.  Neurological: He is alert and oriented to person, place, and time.  Psychiatric: He has a normal mood and affect. His behavior is normal.          Assessment & Plan:   Hypertension well controlled Diabetes mellitus poor control.  In spite of improved diet and weight loss hemoglobin A1c has increased from 7.0-9.2.  Will add Ozempiic 0.25 mg for 2 weeks and then 0.5 mg weekly.  Injection of  GLP-1 agonist demonstrated and samples provided Recheck in 3 months Obesity improved Dyslipidemia continue statin therapy  Christopher Serrano

## 2017-04-18 NOTE — Patient Instructions (Signed)
Limit your sodium (Salt) intake   Please check your hemoglobin A1c every 3 months   

## 2017-06-08 ENCOUNTER — Other Ambulatory Visit: Payer: Self-pay | Admitting: Internal Medicine

## 2017-06-13 ENCOUNTER — Other Ambulatory Visit: Payer: Self-pay | Admitting: Internal Medicine

## 2017-07-18 ENCOUNTER — Ambulatory Visit: Payer: 59 | Admitting: Internal Medicine

## 2017-07-23 ENCOUNTER — Other Ambulatory Visit: Payer: Self-pay | Admitting: Internal Medicine

## 2017-07-25 ENCOUNTER — Ambulatory Visit (INDEPENDENT_AMBULATORY_CARE_PROVIDER_SITE_OTHER): Payer: 59 | Admitting: Internal Medicine

## 2017-07-25 ENCOUNTER — Encounter: Payer: Self-pay | Admitting: Internal Medicine

## 2017-07-25 VITALS — BP 98/60 | HR 99 | Temp 99.0°F | Wt 260.0 lb

## 2017-07-25 DIAGNOSIS — E119 Type 2 diabetes mellitus without complications: Secondary | ICD-10-CM | POA: Diagnosis not present

## 2017-07-25 MED ORDER — GLIPIZIDE ER 5 MG PO TB24: ORAL_TABLET | ORAL | 3 refills | Status: DC

## 2017-07-25 MED ORDER — METFORMIN HCL 1000 MG PO TABS: 1000.0000 mg | ORAL_TABLET | Freq: Two times a day (BID) | ORAL | 3 refills | Status: DC

## 2017-07-25 MED ORDER — LISINOPRIL-HYDROCHLOROTHIAZIDE 20-12.5 MG PO TABS: 2.0000 | ORAL_TABLET | Freq: Every day | ORAL | 3 refills | Status: DC

## 2017-07-25 MED ORDER — SILDENAFIL CITRATE 100 MG PO TABS: 100.0000 mg | ORAL_TABLET | Freq: Every day | ORAL | 3 refills | Status: DC | PRN

## 2017-07-25 MED ORDER — SEMAGLUTIDE(0.25 OR 0.5MG/DOS) 2 MG/1.5ML ~~LOC~~ SOPN: 0.5000 mg | PEN_INJECTOR | SUBCUTANEOUS | 6 refills | Status: DC

## 2017-07-25 NOTE — Patient Instructions (Signed)
Limit your sodium (Salt) intake   Please check your hemoglobin A1c every 3 months    It is important that you exercise regularly, at least 20 minutes 3 to 4 times per week.  If you develop chest pain or shortness of breath seek  medical attention.  You need to lose weight.  Consider a lower calorie diet and regular exercise.  Return in 3 months for follow-up

## 2017-07-25 NOTE — Progress Notes (Signed)
Subjective:    Patient ID: Christopher Serrano, male    DOB: [birth year], 62 y.o.   MRN: [id number]  HPI  62 year old patient who is seen today for follow-up of type 2 diabetes. 3 months ago his hemoglobin A1c increased to 9.2 after a previous reading of 7.0.  He was placed on GLP-1 therapy.   He states his glycemic control has improved nicely.  There is been no significant weight gain  Repeat hemoglobin A1c today back down to 7.0  Past Medical History:  Diagnosis Date  . Allergy   . ASTHMA 07/25/2006  . Depression   . DIABETES MELLITUS, TYPE II 07/25/2006  . HYPERLIPIDEMIA 01/07/2008  . HYPERTENSION 07/25/2006     Social History   Socioeconomic History  . Marital status: Married    Spouse name: Not on file  . Number of children: Not on file  . Years of education: Not on file  . Highest education level: Not on file  Occupational History  . Not on file  Social Needs  . Financial resource strain: Not on file  . Food insecurity:    Worry: Not on file    Inability: Not on file  . Transportation needs:    Medical: Not on file    Non-medical: Not on file  Tobacco Use  . Smoking status: Never Smoker  . Smokeless tobacco: Never Used  Substance and Sexual Activity  . Alcohol use: Yes    Comment: every 6 -7 mths  . Drug use: No  . Sexual activity: Not on file  Lifestyle  . Physical activity:    Days per week: Not on file    Minutes per session: Not on file  . Stress: Not on file  Relationships  . Social connections:    Talks on phone: Not on file    Gets together: Not on file    Attends religious service: Not on file    Active member of club or organization: Not on file    Attends meetings of clubs or organizations: Not on file    Relationship status: Not on file  . Intimate partner violence:    Fear of current or ex partner: Not on file    Emotionally abused: Not on file    Physically abused: Not on file    Forced sexual activity: Not on file  Other Topics Concern  .  Not on file  Social History Narrative  . Not on file    Past Surgical History:  Procedure Laterality Date  . SINUS IRRIGATION      Family History  Problem Relation Age of Onset  . Hypertension Mother   . Dementia Father   . Diabetes Brother   . Colon cancer Neg Hx   . Esophageal cancer Neg Hx   . Pancreatic cancer Neg Hx   . Rectal cancer Neg Hx   . Stomach cancer Neg Hx     Allergies  Allergen Reactions  . Aspirin   . Ibuprofen     REACTION: sob/tightness    Current Outpatient Medications on File Prior to Visit  Medication Sig Dispense Refill  . dapagliflozin propanediol (FARXIGA) 10 MG TABS tablet Take 10 mg by mouth daily. 90 tablet 4  . simvastatin (ZOCOR) 40 MG tablet Take 1 tablet (40 mg total) by mouth at bedtime. 90 tablet 3   No current facility-administered medications on file prior to visit.     BP 98/60 (BP Location: Right Arm, Patient Position: Sitting, Cuff Size: Large)  Pulse 99   Temp 99 F (37.2 C) (Oral)   Wt 260 lb (117.9 kg)   SpO2 99%   BMI 34.30 kg/m     Review of Systems  Constitutional: Negative for appetite change, chills, fatigue and fever.  HENT: Negative for congestion, dental problem, ear pain, hearing loss, sore throat, tinnitus, trouble swallowing and voice change.   Eyes: Negative for pain, discharge and visual disturbance.  Respiratory: Negative for cough, chest tightness, wheezing and stridor.   Cardiovascular: Negative for chest pain, palpitations and leg swelling.  Gastrointestinal: Negative for abdominal distention, abdominal pain, blood in stool, constipation, diarrhea, nausea and vomiting.  Genitourinary: Negative for difficulty urinating, discharge, flank pain, genital sores, hematuria and urgency.  Musculoskeletal: Negative for arthralgias, back pain, gait problem, joint swelling, myalgias and neck stiffness.  Skin: Negative for rash.  Neurological: Negative for dizziness, syncope, speech difficulty, weakness,  numbness and headaches.  Hematological: Negative for adenopathy. Does not bruise/bleed easily.  Psychiatric/Behavioral: Negative for behavioral problems and dysphoric mood. The patient is not nervous/anxious.        Objective:   Physical Exam  Constitutional: He is oriented to person, place, and time. He appears well-developed.  Blood pressure 110/90  HENT:  Head: Normocephalic.  Right Ear: External ear normal.  Left Ear: External ear normal.  Eyes: Conjunctivae and EOM are normal.  Neck: Normal range of motion.  Cardiovascular: Normal rate and normal heart sounds.  Pulmonary/Chest: Breath sounds normal.  Abdominal: Bowel sounds are normal.  Musculoskeletal: Normal range of motion. He exhibits no edema or tenderness.  Neurological: He is alert and oriented to person, place, and time.  Psychiatric: He has a normal mood and affect. His behavior is normal.          Assessment & Plan:   Diabetes mellitus.  Improved hemoglobin A1c presently 7.0 Lifestyle issues discussed and stressed Weight loss encouraged  No change in medical therapy Follow-up 3 months  Marletta Lor

## 2017-07-25 NOTE — Telephone Encounter (Signed)
Patient need to schedule an ov for more refills. Pt had an ov 07/18/2017 and canceled. Pt reschedule for today 07/25/2017. Rx will be filled today if appropriate.

## 2017-12-23 ENCOUNTER — Other Ambulatory Visit: Payer: Self-pay | Admitting: Internal Medicine

## 2018-01-06 ENCOUNTER — Other Ambulatory Visit: Payer: Self-pay | Admitting: Internal Medicine

## 2018-02-11 ENCOUNTER — Other Ambulatory Visit: Payer: Self-pay | Admitting: Internal Medicine

## 2018-04-20 ENCOUNTER — Telehealth: Payer: Self-pay | Admitting: *Deleted

## 2018-04-20 NOTE — Telephone Encounter (Signed)
Patient will need to schedule a TOC for further refills Left message on machine  CRM

## 2018-10-10 ENCOUNTER — Other Ambulatory Visit: Payer: Self-pay

## 2018-10-10 ENCOUNTER — Encounter: Payer: Self-pay | Admitting: Adult Health

## 2018-10-10 ENCOUNTER — Ambulatory Visit (INDEPENDENT_AMBULATORY_CARE_PROVIDER_SITE_OTHER): Payer: 59 | Admitting: Adult Health

## 2018-10-10 VITALS — BP 110/80 | HR 109 | Temp 97.8°F | Wt 251.2 lb

## 2018-10-10 DIAGNOSIS — E119 Type 2 diabetes mellitus without complications: Secondary | ICD-10-CM

## 2018-10-10 DIAGNOSIS — E785 Hyperlipidemia, unspecified: Secondary | ICD-10-CM

## 2018-10-10 DIAGNOSIS — Z23 Encounter for immunization: Secondary | ICD-10-CM | POA: Diagnosis not present

## 2018-10-10 DIAGNOSIS — I10 Essential (primary) hypertension: Secondary | ICD-10-CM

## 2018-10-10 DIAGNOSIS — Z0000 Encounter for general adult medical examination without abnormal findings: Secondary | ICD-10-CM

## 2018-10-10 LAB — POCT GLYCOSYLATED HEMOGLOBIN (HGB A1C): Hemoglobin A1C: 12.8 % — AB (ref 4.0–5.6)

## 2018-10-10 MED ORDER — LISINOPRIL-HYDROCHLOROTHIAZIDE 20-12.5 MG PO TABS: 2.0000 | ORAL_TABLET | Freq: Every day | ORAL | 90 refills | Status: DC

## 2018-10-10 MED ORDER — SIMVASTATIN 40 MG PO TABS: 40.0000 mg | ORAL_TABLET | Freq: Every day | ORAL | 0 refills | Status: DC

## 2018-10-10 MED ORDER — OZEMPIC (0.25 OR 0.5 MG/DOSE) 2 MG/1.5ML ~~LOC~~ SOPN: 0.5000 mg | PEN_INJECTOR | SUBCUTANEOUS | 3 refills | Status: AC

## 2018-10-10 MED ORDER — METFORMIN HCL 1000 MG PO TABS: 1000.0000 mg | ORAL_TABLET | Freq: Two times a day (BID) | ORAL | 1 refills | Status: DC

## 2018-10-10 MED ORDER — GLIPIZIDE ER 10 MG PO TB24: ORAL_TABLET | ORAL | 0 refills | Status: DC

## 2018-10-10 MED ORDER — FARXIGA 10 MG PO TABS: 10.0000 mg | ORAL_TABLET | Freq: Every day | ORAL | 0 refills | Status: DC

## 2018-10-10 NOTE — Progress Notes (Signed)
Patient presents to clinic today to establish care. He is a pleasant 63 year old male who  has a past medical history of Allergy, ASTHMA (07/25/2006), Depression, DIABETES MELLITUS, TYPE II (07/25/2006), HYPERLIPIDEMIA (01/07/2008), and HYPERTENSION (07/25/2006).   He is a former patient of Dr. Raliegh Ip who   Acute Concerns: Establish Care   Chronic Issues: DM - His last A1c was 7.0 - two years ago. He has been out of medication for close to a month. In the past he was glipizide 5 mg XR, Ozempic 0.5 mg, Farxiga 10 mg, and Metformin 100 0mg  BID. He has not been exercising since the gyms have been closed but does try and eat healthy. His A1c today 12.8  Lab Results  Component Value Date   HGBA1C 12.8 (A) 10/10/2018    Hyperlipidemia - Prescribed Simvastatin 40 mg. Denies myalgia or fatigue  Lab Results  Component Value Date   CHOL 149 01/20/2016   HDL 45.10 01/20/2016   LDLCALC 86 01/20/2016   LDLDIRECT 150.4 03/09/2010   TRIG 90.0 01/20/2016   CHOLHDL 3 01/20/2016    Hypertension - Prescribed lisinopril/HCTZ 20-12.5 mg. Denies chest pain, SOB, dizziness, lightheadedness or blurred vision  BP Readings from Last 3 Encounters:  10/10/18 110/80  07/25/17 98/60  04/18/17 118/80   Health Maintenance: Dental -- Does not do routine care Vision -- Does routine care  Immunizations -- Refuses Flu shot  Colonoscopy -- Due in 2028    Past Medical History:  Diagnosis Date  . Allergy   . ASTHMA 07/25/2006  . Depression   . DIABETES MELLITUS, TYPE II 07/25/2006  . HYPERLIPIDEMIA 01/07/2008  . HYPERTENSION 07/25/2006    Past Surgical History:  Procedure Laterality Date  . SINUS IRRIGATION      Current Outpatient Medications on File Prior to Visit  Medication Sig Dispense Refill  . FARXIGA 10 MG TABS tablet TAKE 1 TABLET BY MOUTH DAILY 90 tablet 0  . glipiZIDE (GLUCOTROL XL) 5 MG 24 hr tablet 1 tablet daily in the morning 90 tablet 3  . lisinopril-hydrochlorothiazide  (PRINZIDE,ZESTORETIC) 20-12.5 MG tablet Take 2 tablets by mouth daily. 180 tablet 3  . metFORMIN (GLUCOPHAGE) 1000 MG tablet Take 1 tablet (1,000 mg total) by mouth 2 (two) times daily with a meal. 180 tablet 3  . Semaglutide (OZEMPIC) 0.25 or 0.5 MG/DOSE SOPN Inject 0.5 mg into the skin once a week. 1 pen 6  . sildenafil (VIAGRA) 100 MG tablet Take 1 tablet (100 mg total) by mouth daily as needed for erectile dysfunction. 10 tablet 3  . simvastatin (ZOCOR) 40 MG tablet TAKE 1 TABLET(40 MG) BY MOUTH AT BEDTIME 90 tablet 0   No current facility-administered medications on file prior to visit.     Allergies  Allergen Reactions  . Aspirin   . Ibuprofen     REACTION: sob/tightness    Family History  Problem Relation Age of Onset  . Hypertension Mother   . Dementia Father   . Diabetes Brother   . Colon cancer Neg Hx   . Esophageal cancer Neg Hx   . Pancreatic cancer Neg Hx   . Rectal cancer Neg Hx   . Stomach cancer Neg Hx     Social History   Socioeconomic History  . Marital status: Married    Spouse name: Not on file  . Number of children: Not on file  . Years of education: Not on file  . Highest education level: Not on file  Occupational History  .  Not on file  Social Needs  . Financial resource strain: Not on file  . Food insecurity    Worry: Not on file    Inability: Not on file  . Transportation needs    Medical: Not on file    Non-medical: Not on file  Tobacco Use  . Smoking status: Never Smoker  . Smokeless tobacco: Never Used  Substance and Sexual Activity  . Alcohol use: Yes    Comment: every 6 -7 mths  . Drug use: No  . Sexual activity: Not on file  Lifestyle  . Physical activity    Days per week: Not on file    Minutes per session: Not on file  . Stress: Not on file  Relationships  . Social Herbalist on phone: Not on file    Gets together: Not on file    Attends religious service: Not on file    Active member of club or organization:  Not on file    Attends meetings of clubs or organizations: Not on file    Relationship status: Not on file  . Intimate partner violence    Fear of current or ex partner: Not on file    Emotionally abused: Not on file    Physically abused: Not on file    Forced sexual activity: Not on file  Other Topics Concern  . Not on file  Social History Narrative  . Not on file    Review of Systems  Constitutional: Negative.   HENT: Negative.   Respiratory: Negative.   Cardiovascular: Negative.   Gastrointestinal: Negative.   Genitourinary: Negative.   Musculoskeletal: Negative.   Skin: Negative.   Neurological: Negative.   Psychiatric/Behavioral: Negative.   All other systems reviewed and are negative.   BP 110/80 (BP Location: Left Arm, Patient Position: Sitting, Cuff Size: Large)   Pulse (!) 109   Temp 97.8 F (36.6 C) (Temporal)   Wt 251 lb 3.2 oz (113.9 kg)   SpO2 94%   BMI 33.14 kg/m   Physical Exam Vitals signs and nursing note reviewed.  Constitutional:      Appearance: Normal appearance.  HENT:     Right Ear: Tympanic membrane, ear canal and external ear normal. There is no impacted cerumen.     Left Ear: Tympanic membrane, ear canal and external ear normal. There is no impacted cerumen.     Nose: Nose normal.     Mouth/Throat:     Mouth: Mucous membranes are moist.  Eyes:     Extraocular Movements: Extraocular movements intact.     Pupils: Pupils are equal, round, and reactive to light.  Cardiovascular:     Rate and Rhythm: Normal rate and regular rhythm.     Pulses: Normal pulses.     Heart sounds: Normal heart sounds.  Pulmonary:     Effort: Pulmonary effort is normal.     Breath sounds: Normal breath sounds.  Abdominal:     General: Abdomen is flat.     Palpations: Abdomen is soft.  Musculoskeletal: Normal range of motion.  Skin:    General: Skin is warm and dry.     Capillary Refill: Capillary refill takes less than 2 seconds.  Neurological:      General: No focal deficit present.     Mental Status: He is alert.  Psychiatric:        Mood and Affect: Mood normal.        Behavior: Behavior normal.  Thought Content: Thought content normal.        Judgment: Judgment normal.     Recent Results (from the past 2160 hour(s))  POCT glycosylated hemoglobin (Hb A1C)     Status: Abnormal   Collection Time: 10/10/18  9:06 AM  Result Value Ref Range   Hemoglobin A1C 12.8 (A) 4.0 - 5.6 %   HbA1c POC (<> result, manual entry)     HbA1c, POC (prediabetic range)     HbA1c, POC (controlled diabetic range)      Assessment/Plan: 1. Encounter for medical examination to establish care - Follow up in three months for CPE   2. Type 2 diabetes mellitus without complication, without long-term current use of insulin ([place]) - Needs to work on getting A1c down. Will refill his meds and have him follow up in three months for CPE  - dapagliflozin propanediol (FARXIGA) 10 MG TABS tablet; Take 10 mg by mouth daily.  Dispense: 90 tablet; Refill: 0 - glipiZIDE (GLUCOTROL XL) 10 MG 24 hr tablet; 1 tablet daily in the morning  Dispense: 90 tablet; Refill: 0 - metFORMIN (GLUCOPHAGE) 1000 MG tablet; Take 1 tablet (1,000 mg total) by mouth 2 (two) times daily with a meal.  Dispense: 180 tablet; Refill: 1 - Semaglutide,0.25 or 0.5MG /DOS, (OZEMPIC, 0.25 OR 0.5 MG/DOSE,) 2 MG/1.5ML SOPN; Inject 0.5 mg into the skin once a week.  Dispense: 4 pen; Refill: 3 - POCT glycosylated hemoglobin (Hb A1C)  3. Essential hypertension  - lisinopril-hydrochlorothiazide (ZESTORETIC) 20-12.5 MG tablet; Take 2 tablets by mouth daily.  Dispense: 180 tablet; Refill: 90  4. Dyslipidemia  - simvastatin (ZOCOR) 40 MG tablet; Take 1 tablet (40 mg total) by mouth daily at 6 PM.  Dispense: 90 tablet; Refill: 0  Dorothyann Peng, NP

## 2018-10-10 NOTE — Patient Instructions (Signed)
It was great meeting you today   Please follow up in three months for your physical exam   Work on getting that A1c down

## 2019-01-08 ENCOUNTER — Other Ambulatory Visit: Payer: Self-pay

## 2019-01-09 ENCOUNTER — Encounter: Payer: Self-pay | Admitting: Adult Health

## 2019-01-09 ENCOUNTER — Ambulatory Visit (INDEPENDENT_AMBULATORY_CARE_PROVIDER_SITE_OTHER): Payer: 59 | Admitting: Adult Health

## 2019-01-09 VITALS — BP 120/96 | Temp 97.8°F | Wt 255.0 lb

## 2019-01-09 DIAGNOSIS — E119 Type 2 diabetes mellitus without complications: Secondary | ICD-10-CM | POA: Diagnosis not present

## 2019-01-09 DIAGNOSIS — G479 Sleep disorder, unspecified: Secondary | ICD-10-CM

## 2019-01-09 LAB — POCT GLYCOSYLATED HEMOGLOBIN (HGB A1C): HbA1c, POC (controlled diabetic range): 8.6 % — AB (ref 0.0–7.0)

## 2019-01-09 MED ORDER — TRAZODONE HCL 50 MG PO TABS: 25.0000 mg | ORAL_TABLET | Freq: Every evening | ORAL | 0 refills | Status: DC | PRN

## 2019-01-09 MED ORDER — FARXIGA 10 MG PO TABS: 10.0000 mg | ORAL_TABLET | Freq: Every day | ORAL | 0 refills | Status: DC

## 2019-01-09 MED ORDER — TRULICITY 0.75 MG/0.5ML ~~LOC~~ SOAJ: 0.7500 mg | SUBCUTANEOUS | 3 refills | Status: DC

## 2019-01-09 MED ORDER — GLIPIZIDE ER 10 MG PO TB24: ORAL_TABLET | ORAL | 0 refills | Status: DC

## 2019-01-09 NOTE — Progress Notes (Signed)
Subjective:    Patient ID: Christopher Serrano, male    DOB: 1955/03/20, 63 y.o.   MRN: [id number]  HPI   63 year old male who is being evaluated today for 42-month follow-up regarding diabetes mellitus.  During his initial appointment he had been out of medication and his A1c had gone from 7.0-12.8.  He was represcribed glipizide 10 mg extended release, Farxiga 10 mg daily, Metformin 1000 mg twice daily, and Ozempic.  Today he reports that he has been under a lot of stress, his mother died approximately a month ago and he has had a hard time dealing with this.  He reports that he has not been exercising as much as he would like and his diet has suffered he has he is doing a lot of stress eating and eating junk food.  He is checking his blood sugars here and there and reports readings in the 180s to 220s.  He has noticed that his blood sugars have come down since the last time he was seen  His acute issue today is that of sleep disturbance.  He reports that with all the stress in trying to deal with the passing of his mother he has been unable to sleep.  He has a hard time falling asleep as well as staying asleep.  Does not necessarily feel depressed or anxious.   Wt Readings from Last 3 Encounters:  01/09/19 255 lb (115.7 kg)  10/10/18 251 lb 3.2 oz (113.9 kg)  07/25/17 260 lb (117.9 kg)     Review of Systems See HPI   Past Medical History:  Diagnosis Date  . Allergy   . ASTHMA 07/25/2006  . Depression   . DIABETES MELLITUS, TYPE II 07/25/2006  . HYPERLIPIDEMIA 01/07/2008  . HYPERTENSION 07/25/2006    Social History   Socioeconomic History  . Marital status: Married    Spouse name: Not on file  . Number of children: Not on file  . Years of education: Not on file  . Highest education level: Not on file  Occupational History  . Not on file  Tobacco Use  . Smoking status: Never Smoker  . Smokeless tobacco: Never Used  Substance and Sexual Activity  . Alcohol use: Yes    Comment:  every 6 -7 mths  . Drug use: No  . Sexual activity: Not on file  Other Topics Concern  . Not on file  Social History Narrative   Patent attorney    Social Determinants of Health   Financial Resource Strain:   . Difficulty of Paying Living Expenses: Not on file  Food Insecurity:   . Worried About Charity fundraiser in the Last Year: Not on file  . Ran Out of Food in the Last Year: Not on file  Transportation Needs:   . Lack of Transportation (Medical): Not on file  . Lack of Transportation (Non-Medical): Not on file  Physical Activity:   . Days of Exercise per Week: Not on file  . Minutes of Exercise per Session: Not on file  Stress:   . Feeling of Stress : Not on file  Social Connections:   . Frequency of Communication with Friends and Family: Not on file  . Frequency of Social Gatherings with Friends and Family: Not on file  . Attends Religious Services: Not on file  . Active Member of Clubs or Organizations: Not on file  . Attends Archivist Meetings: Not on file  . Marital Status: Not on file  Intimate Partner Violence:   . Fear of Current or Ex-Partner: Not on file  . Emotionally Abused: Not on file  . Physically Abused: Not on file  . Sexually Abused: Not on file    Past Surgical History:  Procedure Laterality Date  . SINUS IRRIGATION      Family History  Problem Relation Age of Onset  . Hypertension Mother   . Dementia Father   . Diabetes Brother   . Colon cancer Neg Hx   . Esophageal cancer Neg Hx   . Pancreatic cancer Neg Hx   . Rectal cancer Neg Hx   . Stomach cancer Neg Hx     Allergies  Allergen Reactions  . Aleve [Naproxen] Shortness Of Breath  . Aspirin   . Ibuprofen     REACTION: sob/tightness    Current Outpatient Medications on File Prior to Visit  Medication Sig Dispense Refill  . lisinopril-hydrochlorothiazide (ZESTORETIC) 20-12.5 MG tablet Take 2 tablets by mouth daily. 180 tablet 90  . metFORMIN (GLUCOPHAGE) 1000 MG  tablet Take 1 tablet (1,000 mg total) by mouth 2 (two) times daily with a meal. 180 tablet 1  . sildenafil (VIAGRA) 100 MG tablet Take 1 tablet (100 mg total) by mouth daily as needed for erectile dysfunction. 10 tablet 3  . simvastatin (ZOCOR) 40 MG tablet Take 1 tablet (40 mg total) by mouth daily at 6 PM. 90 tablet 0   No current facility-administered medications on file prior to visit.    BP (!) 120/96   Temp 97.8 F (36.6 C) (Temporal)   Wt 255 lb (115.7 kg)   BMI 33.64 kg/m       Objective:   Physical Exam Vitals and nursing note reviewed.  Constitutional:      Appearance: Normal appearance.  Cardiovascular:     Rate and Rhythm: Normal rate and regular rhythm.     Pulses: Normal pulses.     Heart sounds: Normal heart sounds.  Pulmonary:     Effort: Pulmonary effort is normal.     Breath sounds: Normal breath sounds.  Skin:    General: Skin is warm and dry.     Capillary Refill: Capillary refill takes less than 2 seconds.  Neurological:     General: No focal deficit present.     Mental Status: He is alert and oriented to person, place, and time.  Psychiatric:        Mood and Affect: Mood normal.        Behavior: Behavior normal.        Thought Content: Thought content normal.        Judgment: Judgment normal.        Assessment & Plan:  1. Type 2 diabetes mellitus without complication, without long-term current use of insulin ([facility])  - POCT A1C - 8.4 - has improved.  -His A1c has improved.  He was advised to continue to work on diet and exercise.  We will switch from Ozempic to Trulicity to see if this saves him any money.  Follow-up in 3 months or sooner if needed - dapagliflozin propanediol (FARXIGA) 10 MG TABS tablet; Take 10 mg by mouth daily.  Dispense: 90 tablet; Refill: 0 - glipiZIDE (GLUCOTROL XL) 10 MG 24 hr tablet; 1 tablet daily in the morning  Dispense: 90 tablet; Refill: 0 - Dulaglutide (TRULICITY) A999333 0000000 SOPN; Inject 0.75 mg into the skin once  a week.  Dispense: 4 pen; Refill: 3  2. Sleep disturbance  - traZODone (DESYREL)  50 MG tablet; Take 0.5-1 tablets (25-50 mg total) by mouth at bedtime as needed for sleep.  Dispense: 30 tablet; Refill: 0  Dorothyann Peng, NP

## 2019-02-05 ENCOUNTER — Other Ambulatory Visit: Payer: Self-pay | Admitting: Adult Health

## 2019-02-05 DIAGNOSIS — G479 Sleep disorder, unspecified: Secondary | ICD-10-CM

## 2019-03-03 ENCOUNTER — Other Ambulatory Visit: Payer: Self-pay | Admitting: Adult Health

## 2019-03-03 DIAGNOSIS — E785 Hyperlipidemia, unspecified: Secondary | ICD-10-CM

## 2019-03-11 ENCOUNTER — Other Ambulatory Visit: Payer: Self-pay | Admitting: Adult Health

## 2019-03-11 DIAGNOSIS — G479 Sleep disorder, unspecified: Secondary | ICD-10-CM

## 2019-04-16 ENCOUNTER — Other Ambulatory Visit: Payer: Self-pay | Admitting: Adult Health

## 2019-04-16 DIAGNOSIS — G479 Sleep disorder, unspecified: Secondary | ICD-10-CM

## 2019-04-16 DIAGNOSIS — E119 Type 2 diabetes mellitus without complications: Secondary | ICD-10-CM

## 2019-04-17 ENCOUNTER — Other Ambulatory Visit: Payer: Self-pay

## 2019-04-18 ENCOUNTER — Encounter: Payer: Self-pay | Admitting: Adult Health

## 2019-04-18 ENCOUNTER — Ambulatory Visit (INDEPENDENT_AMBULATORY_CARE_PROVIDER_SITE_OTHER): Payer: 59 | Admitting: Adult Health

## 2019-04-18 VITALS — BP 114/80 | Temp 98.0°F | Wt 258.0 lb

## 2019-04-18 DIAGNOSIS — E785 Hyperlipidemia, unspecified: Secondary | ICD-10-CM

## 2019-04-18 DIAGNOSIS — E119 Type 2 diabetes mellitus without complications: Secondary | ICD-10-CM | POA: Diagnosis not present

## 2019-04-18 LAB — POCT GLYCOSYLATED HEMOGLOBIN (HGB A1C): HbA1c, POC (controlled diabetic range): 7.1 % — AB (ref 0.0–7.0)

## 2019-04-18 MED ORDER — TRULICITY 0.75 MG/0.5ML ~~LOC~~ SOAJ: 0.7500 mg | SUBCUTANEOUS | 3 refills | Status: DC

## 2019-04-18 MED ORDER — METFORMIN HCL 1000 MG PO TABS: 1000.0000 mg | ORAL_TABLET | Freq: Two times a day (BID) | ORAL | 0 refills | Status: DC

## 2019-04-18 MED ORDER — FARXIGA 10 MG PO TABS: 10.0000 mg | ORAL_TABLET | Freq: Every day | ORAL | 0 refills | Status: DC

## 2019-04-18 MED ORDER — SIMVASTATIN 40 MG PO TABS: ORAL_TABLET | ORAL | 3 refills | Status: DC

## 2019-04-18 NOTE — Patient Instructions (Addendum)
It was great seeing you today.   Your A1c is 7.1 - this has come down from 8.6   Please follow up in three months for your physical exam.   Work on diet and exercise. If you start to feel like you are having low blood sugars then please stop taking glipizide

## 2019-04-18 NOTE — Progress Notes (Signed)
Subjective:    Patient ID: Christopher Serrano, male    DOB: 1955/04/07, 64 y.o.   MRN: [id number]  HPI 64 year old male who  has a past medical history of Allergy, ASTHMA (07/25/2006), Depression, DIABETES MELLITUS, TYPE II (07/25/2006), HYPERLIPIDEMIA (01/07/2008), and HYPERTENSION (07/25/2006).  He presents to the office today for follow up regarding diabetes mellitus. He is currently prescribed Glipizide 10 mg XR, Metformin 1000 mg, Farxiga 5 mg, and Trulicity [id number] mg.   His last A1c was 8.6 in December 2020 and this was down from 12.8.   Today he reports that he has started changing his diet a little bit but continues to eat a lot of junk food and drinks sodas.  He has not been exercising due to working long hours and he knows that his weight is up.  He would like to get down to 235 to 240 pounds.  He does not check his blood sugars on a routine basis but he has not felt any episodes of hypoglycemia.  Wt Readings from Last 3 Encounters:  04/18/19 258 lb (117 kg)  01/09/19 255 lb (115.7 kg)  10/10/18 251 lb 3.2 oz (113.9 kg)    Review of Systems See HPI   Past Medical History:  Diagnosis Date  . Allergy   . ASTHMA 07/25/2006  . Depression   . DIABETES MELLITUS, TYPE II 07/25/2006  . HYPERLIPIDEMIA 01/07/2008  . HYPERTENSION 07/25/2006    Social History   Socioeconomic History  . Marital status: Married    Spouse name: Not on file  . Number of children: Not on file  . Years of education: Not on file  . Highest education level: Not on file  Occupational History  . Not on file  Tobacco Use  . Smoking status: Never Smoker  . Smokeless tobacco: Never Used  Substance and Sexual Activity  . Alcohol use: Yes    Comment: every 6 -7 mths  . Drug use: No  . Sexual activity: Not on file  Other Topics Concern  . Not on file  Social History Narrative   Patent attorney    Social Determinants of Health   Financial Resource Strain:   . Difficulty of Paying Living Expenses:     Food Insecurity:   . Worried About Charity fundraiser in the Last Year:   . Arboriculturist in the Last Year:   Transportation Needs:   . Film/video editor (Medical):   Marland Kitchen Lack of Transportation (Non-Medical):   Physical Activity:   . Days of Exercise per Week:   . Minutes of Exercise per Session:   Stress:   . Feeling of Stress :   Social Connections:   . Frequency of Communication with Friends and Family:   . Frequency of Social Gatherings with Friends and Family:   . Attends Religious Services:   . Active Member of Clubs or Organizations:   . Attends Archivist Meetings:   Marland Kitchen Marital Status:   Intimate Partner Violence:   . Fear of Current or Ex-Partner:   . Emotionally Abused:   Marland Kitchen Physically Abused:   . Sexually Abused:     Past Surgical History:  Procedure Laterality Date  . SINUS IRRIGATION      Family History  Problem Relation Age of Onset  . Hypertension Mother   . Dementia Father   . Diabetes Brother   . Colon cancer Neg Hx   . Esophageal cancer Neg Hx   .  Pancreatic cancer Neg Hx   . Rectal cancer Neg Hx   . Stomach cancer Neg Hx     Allergies  Allergen Reactions  . Aleve [Naproxen] Shortness Of Breath  . Aspirin   . Ibuprofen     REACTION: sob/tightness    Current Outpatient Medications on File Prior to Visit  Medication Sig Dispense Refill  . glipiZIDE (GLIPIZIDE XL) 10 MG 24 hr tablet TAKE 1 TABLET BY MOUTH DAILY IN THE MORNING 90 tablet 0  . lisinopril-hydrochlorothiazide (ZESTORETIC) 20-12.5 MG tablet Take 2 tablets by mouth daily. 180 tablet 90  . sildenafil (VIAGRA) 100 MG tablet Take 1 tablet (100 mg total) by mouth daily as needed for erectile dysfunction. (Patient not taking: Reported on 04/18/2019) 10 tablet 3   No current facility-administered medications on file prior to visit.    BP 114/80   Temp 98 F (36.7 C)   Wt 258 lb (117 kg)   BMI 34.04 kg/m       Objective:   Physical Exam Vitals and nursing note  reviewed.  Constitutional:      Appearance: Normal appearance.  Cardiovascular:     Rate and Rhythm: Normal rate and regular rhythm.     Pulses: Normal pulses.     Heart sounds: Normal heart sounds.  Pulmonary:     Effort: Pulmonary effort is normal.     Breath sounds: Normal breath sounds.  Neurological:     General: No focal deficit present.     Mental Status: He is alert and oriented to person, place, and time.  Psychiatric:        Mood and Affect: Mood normal.        Behavior: Behavior normal.        Thought Content: Thought content normal.        Judgment: Judgment normal.       Assessment & Plan:  1. Type 2 diabetes mellitus without complication, without long-term current use of insulin ([facility])  - POCT A1C - 7.1  -His A1c has improved.  Encourage the patient to start exercising and eating a heart healthy diet if he does so I am convinced that we can take him off some of his diabetes medications.  Was advised to follow-up in 3 months for his CPE or sooner if needed - Dulaglutide (TRULICITY) A999333 0000000 SOPN; Inject 0.75 mg into the skin once a week.  Dispense: 4 pen; Refill: 3 - dapagliflozin propanediol (FARXIGA) 10 MG TABS tablet; Take 10 mg by mouth daily.  Dispense: 90 tablet; Refill: 0 - metFORMIN (GLUCOPHAGE) 1000 MG tablet; Take 1 tablet (1,000 mg total) by mouth 2 (two) times daily with a meal.  Dispense: 180 tablet; Refill: 0  2. Dyslipidemia -  (ZOCOR) 40 MG tablet; TAKE 1 TABLET(40 MG) BY MOUTH DAILY AT 6 PM  Dispense: 90 tablet; Refill: 3  Dorothyann Peng, NP

## 2019-05-01 ENCOUNTER — Other Ambulatory Visit: Payer: Self-pay | Admitting: Adult Health

## 2019-05-01 MED ORDER — DOXYCYCLINE HYCLATE 100 MG PO CAPS: 100.0000 mg | ORAL_CAPSULE | Freq: Two times a day (BID) | ORAL | 0 refills | Status: DC

## 2019-07-22 ENCOUNTER — Other Ambulatory Visit: Payer: Self-pay | Admitting: Adult Health

## 2019-07-22 DIAGNOSIS — E119 Type 2 diabetes mellitus without complications: Secondary | ICD-10-CM

## 2019-07-27 ENCOUNTER — Other Ambulatory Visit: Payer: Self-pay

## 2019-07-27 ENCOUNTER — Encounter: Payer: Self-pay | Admitting: Adult Health

## 2019-07-27 ENCOUNTER — Ambulatory Visit (INDEPENDENT_AMBULATORY_CARE_PROVIDER_SITE_OTHER): Payer: 59 | Admitting: Adult Health

## 2019-07-27 VITALS — BP 116/80 | Temp 97.2°F | Wt 253.0 lb

## 2019-07-27 DIAGNOSIS — E119 Type 2 diabetes mellitus without complications: Secondary | ICD-10-CM

## 2019-07-27 LAB — POCT GLYCOSYLATED HEMOGLOBIN (HGB A1C): HbA1c, POC (controlled diabetic range): 6.6 % (ref 0.0–7.0)

## 2019-07-27 MED ORDER — DAPAGLIFLOZIN PROPANEDIOL 10 MG PO TABS: 10.0000 mg | ORAL_TABLET | Freq: Every day | ORAL | 0 refills | Status: DC

## 2019-07-27 MED ORDER — TRULICITY 0.75 MG/0.5ML ~~LOC~~ SOAJ: 0.7500 mg | SUBCUTANEOUS | 2 refills | Status: DC

## 2019-07-27 NOTE — Patient Instructions (Signed)
Your A1c has improved more to 6.6 - you are doing great   Please schedule a complete physical in 3 months and we will retest then

## 2019-07-27 NOTE — Progress Notes (Signed)
Subjective:    Patient ID: Christopher Serrano, male    DOB: Jul 14, 1955, 64 y.o.   MRN: [id number]  HPI 64 year old male who  has a past medical history of Allergy, ASTHMA (07/25/2006), Depression, DIABETES MELLITUS, TYPE II (07/25/2006), HYPERLIPIDEMIA (01/07/2008), and HYPERTENSION (07/25/2006).  He presents to the office today for three month follow up regarding DM. He is currently prescribed Farxiga 10 mg, Trulicity 7.61 mg, glipizide 10 mg XR, and Metformin 1000 mg BID. He has been making some changes in his lifestyle, is eating healthier, no longer drinking soda and has been staying more active. He has not experienced any glycemic episodes.   Lab Results  Component Value Date   HGBA1C 7.1 (A) 04/18/2019   Wt Readings from Last 3 Encounters:  07/27/19 253 lb (114.8 kg)  04/18/19 258 lb (117 kg)  01/09/19 255 lb (115.7 kg)     Review of Systems See HPI   Past Medical History:  Diagnosis Date  . Allergy   . ASTHMA 07/25/2006  . Depression   . DIABETES MELLITUS, TYPE II 07/25/2006  . HYPERLIPIDEMIA 01/07/2008  . HYPERTENSION 07/25/2006    Social History   Socioeconomic History  . Marital status: Married    Spouse name: Not on file  . Number of children: Not on file  . Years of education: Not on file  . Highest education level: Not on file  Occupational History  . Not on file  Tobacco Use  . Smoking status: Never Smoker  . Smokeless tobacco: Never Used  Substance and Sexual Activity  . Alcohol use: Yes    Comment: every 6 -7 mths  . Drug use: No  . Sexual activity: Not on file  Other Topics Concern  . Not on file  Social History Narrative   Patent attorney    Social Determinants of Health   Financial Resource Strain:   . Difficulty of Paying Living Expenses:   Food Insecurity:   . Worried About Charity fundraiser in the Last Year:   . Arboriculturist in the Last Year:   Transportation Needs:   . Film/video editor (Medical):   Marland Kitchen Lack of Transportation  (Non-Medical):   Physical Activity:   . Days of Exercise per Week:   . Minutes of Exercise per Session:   Stress:   . Feeling of Stress :   Social Connections:   . Frequency of Communication with Friends and Family:   . Frequency of Social Gatherings with Friends and Family:   . Attends Religious Services:   . Active Member of Clubs or Organizations:   . Attends Archivist Meetings:   Marland Kitchen Marital Status:   Intimate Partner Violence:   . Fear of Current or Ex-Partner:   . Emotionally Abused:   Marland Kitchen Physically Abused:   . Sexually Abused:     Past Surgical History:  Procedure Laterality Date  . SINUS IRRIGATION      Family History  Problem Relation Age of Onset  . Hypertension Mother   . Dementia Father   . Diabetes Brother   . Colon cancer Neg Hx   . Esophageal cancer Neg Hx   . Pancreatic cancer Neg Hx   . Rectal cancer Neg Hx   . Stomach cancer Neg Hx     Allergies  Allergen Reactions  . Aleve [Naproxen] Shortness Of Breath  . Aspirin   . Ibuprofen     REACTION: sob/tightness    Current Outpatient  Medications on File Prior to Visit  Medication Sig Dispense Refill  . dapagliflozin propanediol (FARXIGA) 10 MG TABS tablet Take 10 mg by mouth daily. 90 tablet 0  . Dulaglutide (TRULICITY) 2.13 YQ/6.5HQ SOPN Inject 0.75 mg into the skin once a week. 4 pen 3  . glipiZIDE (GLUCOTROL XL) 10 MG 24 hr tablet TAKE 1 TABLET BY MOUTH DAILY IN THE MORNING 90 tablet 0  . lisinopril-hydrochlorothiazide (ZESTORETIC) 20-12.5 MG tablet Take 2 tablets by mouth daily. 180 tablet 90  . metFORMIN (GLUCOPHAGE) 1000 MG tablet TAKE 1 TABLET(1000 MG) BY MOUTH TWICE DAILY WITH A MEAL 180 tablet 0  . sildenafil (VIAGRA) 100 MG tablet Take 1 tablet (100 mg total) by mouth daily as needed for erectile dysfunction. 10 tablet 3  . simvastatin (ZOCOR) 40 MG tablet TAKE 1 TABLET(40 MG) BY MOUTH DAILY AT 6 PM 90 tablet 3   No current facility-administered medications on file prior to visit.      BP 116/80   Temp (!) 97.2 F (36.2 C)   Wt 253 lb (114.8 kg)   BMI 33.38 kg/m      Objective:   Physical Exam Vitals and nursing note reviewed.  Constitutional:      Appearance: Normal appearance.  Cardiovascular:     Rate and Rhythm: Normal rate and regular rhythm.     Pulses: Normal pulses.     Heart sounds: Normal heart sounds.  Pulmonary:     Effort: Pulmonary effort is normal.     Breath sounds: Normal breath sounds.  Musculoskeletal:        General: Normal range of motion.  Skin:    General: Skin is warm and dry.     Capillary Refill: Capillary refill takes less than 2 seconds.  Neurological:     General: No focal deficit present.     Mental Status: He is alert and oriented to person, place, and time.       Assessment & Plan:  1. Type 2 diabetes mellitus without complication, without long-term current use of insulin (HCC)  - POCT A1C- 6.6 - has improved even more.  - Continue with lifestyle modifications.  - Follow up in three months for CPE - Hopefully can increase trulicity at that point and decrease dose of glipizide.  - Dulaglutide (TRULICITY) 4.69 GE/9.5MW SOPN; Inject 0.5 mLs (0.75 mg total) into the skin once a week.  Dispense: 4 pen; Refill: 2 - dapagliflozin propanediol (FARXIGA) 10 MG TABS tablet; Take 1 tablet (10 mg total) by mouth daily.  Dispense: 90 tablet; Refill: 0   Dorothyann Peng, NP

## 2019-08-22 ENCOUNTER — Other Ambulatory Visit: Payer: Self-pay | Admitting: Adult Health

## 2019-08-22 DIAGNOSIS — E119 Type 2 diabetes mellitus without complications: Secondary | ICD-10-CM

## 2019-08-22 NOTE — Telephone Encounter (Signed)
SENT IN ON 07/27/2019 FOR 90 DAYS.

## 2019-10-20 ENCOUNTER — Other Ambulatory Visit: Payer: Self-pay | Admitting: Adult Health

## 2019-10-20 DIAGNOSIS — E119 Type 2 diabetes mellitus without complications: Secondary | ICD-10-CM

## 2019-10-25 ENCOUNTER — Other Ambulatory Visit: Payer: Self-pay | Admitting: Adult Health

## 2019-10-25 DIAGNOSIS — E119 Type 2 diabetes mellitus without complications: Secondary | ICD-10-CM

## 2019-10-27 ENCOUNTER — Other Ambulatory Visit: Payer: Self-pay | Admitting: Adult Health

## 2019-10-27 DIAGNOSIS — I10 Essential (primary) hypertension: Secondary | ICD-10-CM

## 2019-10-29 MED ORDER — LISINOPRIL-HYDROCHLOROTHIAZIDE 20-12.5 MG PO TABS: 2.0000 | ORAL_TABLET | Freq: Every day | ORAL | 2 refills | Status: DC

## 2019-10-29 NOTE — Addendum Note (Signed)
Addended by: Nathanial Millman E on: 10/29/2019 12:39 PM   Modules accepted: Orders

## 2019-11-23 ENCOUNTER — Other Ambulatory Visit (HOSPITAL_COMMUNITY)
Admission: RE | Admit: 2019-11-23 | Discharge: 2019-11-23 | Disposition: A | Discharge status: Home / Self Care | Payer: 59 | Source: Ambulatory Visit | Attending: Adult Health | Admitting: Adult Health

## 2019-11-23 ENCOUNTER — Other Ambulatory Visit: Payer: Self-pay

## 2019-11-23 ENCOUNTER — Ambulatory Visit (INDEPENDENT_AMBULATORY_CARE_PROVIDER_SITE_OTHER): Payer: 59 | Admitting: Adult Health

## 2019-11-23 VITALS — BP 110/72 | HR 102 | Temp 98.8°F | Ht 76.0 in | Wt 253.2 lb

## 2019-11-23 DIAGNOSIS — Z0000 Encounter for general adult medical examination without abnormal findings: Secondary | ICD-10-CM

## 2019-11-23 DIAGNOSIS — E785 Hyperlipidemia, unspecified: Secondary | ICD-10-CM | POA: Insufficient documentation

## 2019-11-23 DIAGNOSIS — E119 Type 2 diabetes mellitus without complications: Secondary | ICD-10-CM | POA: Insufficient documentation

## 2019-11-23 DIAGNOSIS — N4889 Other specified disorders of penis: Secondary | ICD-10-CM | POA: Diagnosis not present

## 2019-11-23 DIAGNOSIS — J302 Other seasonal allergic rhinitis: Secondary | ICD-10-CM | POA: Diagnosis not present

## 2019-11-23 DIAGNOSIS — Z23 Encounter for immunization: Secondary | ICD-10-CM | POA: Diagnosis not present

## 2019-11-23 DIAGNOSIS — I10 Essential (primary) hypertension: Secondary | ICD-10-CM | POA: Diagnosis not present

## 2019-11-23 MED ORDER — PHENAZOPYRIDINE HCL 100 MG PO TABS: 100.0000 mg | ORAL_TABLET | Freq: Three times a day (TID) | ORAL | 0 refills | Status: DC | PRN

## 2019-11-23 MED ORDER — FLUTICASONE PROPIONATE 50 MCG/ACT NA SUSP: 2.0000 | Freq: Every day | NASAL | 6 refills | Status: DC

## 2019-11-23 NOTE — Progress Notes (Signed)
Subjective:    Patient ID: Christopher Serrano, male    DOB: April 09, 1955, 64 y.o.   MRN: [id number]  HPI  Patient presents for yearly preventative medicine examination. He is a pleasant 64 year old male who . has a past medical history of Allergy, ASTHMA (07/25/2006), Depression, DIABETES MELLITUS, TYPE II (07/25/2006), HYPERLIPIDEMIA (01/07/2008), and HYPERTENSION (07/25/2006).  DM -he is currently prescribed Farxiga 10 mg, Trulicity 2.83 mg, glipizide 10 mg extended release daily, and Metformin 1000 mg twice daily.  He continues to work on lifestyle modifications, is no longer drinking soda has been being more active.  He denies any hypoglycemic events.  He has been monitoring his blood sugars at home and reports that they have been "good", usually between 110 and 140.  Lab Results  Component Value Date   HGBA1C 6.6 07/27/2019   Hyperlipidemia -currently prescribed simvastatin 40 mg daily.  He denies myalgia or fatigue Lab Results  Component Value Date   CHOL 149 01/20/2016   HDL 45.10 01/20/2016   LDLCALC 86 01/20/2016   LDLDIRECT 150.4 03/09/2010   TRIG 90.0 01/20/2016   CHOLHDL 3 01/20/2016   Hypertension -takes hydrochlorothiazide/lisinopril 20-12.5 mg.  He denies dizziness, lightheadedness, chest pain, or shortness of breath.  He has not noticed any swelling in his lower extremities. BP Readings from Last 3 Encounters:  11/23/19 110/72  07/27/19 116/80  04/18/19 114/80   Acute Issues  1) irritation of his Penis -started about 2 weeks ago, feels as though there is a "cut" at the tip of his penis.  He denies burning with urination, discharge from his penis, or swelling of his testicles.  Does report that about 3 weeks ago he had unprotected sex with his ex.   2) Sinus Congestion -has been present for approximately 1 week.  Reports sinus congestion but no sinus pain or pressure, cough, shortness of breath, or wheezing.  He has not been using anything over-the-counter.  All immunizations  and health maintenance protocols were reviewed with the patient and needed orders were placed.  Appropriate screening laboratory values were ordered for the patient including screening of hyperlipidemia, renal function and hepatic function. If indicated by BPH, a PSA was ordered.  Medication reconciliation,  past medical history, social history, problem list and allergies were reviewed in detail with the patient  Goals were established with regard to weight loss, exercise, and  diet in compliance with medications Wt Readings from Last 3 Encounters:  11/23/19 253 lb 3.2 oz (114.9 kg)  07/27/19 253 lb (114.8 kg)  04/18/19 258 lb (117 kg)    He is up-to-date on routine colon cancer screening  Review of Systems  Constitutional: Negative.   HENT: Positive for congestion.   Eyes: Negative.   Respiratory: Negative.   Cardiovascular: Negative.   Gastrointestinal: Negative.   Endocrine: Negative.   Genitourinary: Positive for penile pain. Negative for difficulty urinating, discharge, dysuria, flank pain, frequency, genital sores, hematuria, penile swelling, scrotal swelling, testicular pain and urgency.  Musculoskeletal: Negative.   Skin: Negative.   Allergic/Immunologic: Negative.   Neurological: Negative.   Hematological: Negative.   Psychiatric/Behavioral: Negative.   All other systems reviewed and are negative.  Past Medical History:  Diagnosis Date   Allergy    ASTHMA 07/25/2006   Depression    DIABETES MELLITUS, TYPE II 07/25/2006   HYPERLIPIDEMIA 01/07/2008   HYPERTENSION 07/25/2006    Social History   Socioeconomic History   Marital status: Married    Spouse name: Not on file  Number of children: Not on file   Years of education: Not on file   Highest education level: Not on file  Occupational History   Not on file  Tobacco Use   Smoking status: Never Smoker   Smokeless tobacco: Never Used  Substance and Sexual Activity   Alcohol use: Yes     Comment: every 6 -7 mths   Drug use: No   Sexual activity: Not on file  Other Topics Concern   Not on file  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    Social Determinants of Health   Financial Resource Strain:    Difficulty of Paying Living Expenses: Not on file  Food Insecurity:    Worried About Charity fundraiser in the Last Year: Not on file   [employer] of Food in the Last Year: Not on file  Transportation Needs:    Lack of Transportation (Medical): Not on file   Lack of Transportation (Non-Medical): Not on file  Physical Activity:    Days of Exercise per Week: Not on file   Minutes of Exercise per Session: Not on file  Stress:    Feeling of Stress : Not on file  Social Connections:    Frequency of Communication with Friends and Family: Not on file   Frequency of Social Gatherings with Friends and Family: Not on file   Attends Religious Services: Not on file   Active Member of Clubs or Organizations: Not on file   Attends Archivist Meetings: Not on file   Marital Status: Not on file  Intimate Partner Violence:    Fear of Current or Ex-Partner: Not on file   Emotionally Abused: Not on file   Physically Abused: Not on file   Sexually Abused: Not on file    Past Surgical History:  Procedure Laterality Date   SINUS IRRIGATION      Family History  Problem Relation Age of Onset   Hypertension Mother    Dementia Father    Diabetes Brother    Colon cancer Neg Hx    Esophageal cancer Neg Hx    Pancreatic cancer Neg Hx    Rectal cancer Neg Hx    Stomach cancer Neg Hx     Allergies  Allergen Reactions   Aleve [Naproxen] Shortness Of Breath   Aspirin    Ibuprofen     REACTION: sob/tightness    Current Outpatient Medications on File Prior to Visit  Medication Sig Dispense Refill   dapagliflozin propanediol (FARXIGA) 10 MG TABS tablet Take 1 tablet (10 mg total) by mouth daily. 90 tablet 0   glipiZIDE (GLUCOTROL  XL) 10 MG 24 hr tablet TAKE 1 TABLET BY MOUTH DAILY IN THE MORNING 90 tablet 0   lisinopril-hydrochlorothiazide (ZESTORETIC) 20-12.5 MG tablet Take 2 tablets by mouth daily. 180 tablet 2   metFORMIN (GLUCOPHAGE) 1000 MG tablet TAKE 1 TABLET(1000 MG) BY MOUTH TWICE DAILY WITH A MEAL 449 tablet 0   TRULICITY 2.01 EO/7.1QR SOPN ADMINISTER 0.75 MG UNDER THE SKIN 1 TIME A WEEK 2 mL 1   sildenafil (VIAGRA) 100 MG tablet Take 1 tablet (100 mg total) by mouth daily as needed for erectile dysfunction. (Patient not taking: Reported on 11/23/2019) 10 tablet 3   simvastatin (ZOCOR) 40 MG tablet TAKE 1 TABLET(40 MG) BY MOUTH DAILY AT 6 PM (Patient not taking: Reported on 11/23/2019) 90 tablet 3   No current facility-administered medications on file prior to visit.    BP 110/72  Pulse (!) 102    Temp 98.8 F (37.1 C) (Oral)    Ht _0  (1.93 m)    Wt 253 lb 3.2 oz (114.9 kg)    SpO2 98%    BMI 30.82 kg/m       Objective:   Physical Exam Vitals and nursing note reviewed.  Constitutional:      General: He is not in acute distress.    Appearance: Normal appearance. He is well-developed. He is obese.  HENT:     Head: Normocephalic and atraumatic.     Right Ear: Tympanic membrane, ear canal and external ear normal. There is no impacted cerumen.     Left Ear: Tympanic membrane, ear canal and external ear normal. There is no impacted cerumen.     Nose: Nose normal. No congestion or rhinorrhea.     Comments: Turbinates slightly swollen    Mouth/Throat:     Mouth: Mucous membranes are moist.     Pharynx: Oropharynx is clear. No oropharyngeal exudate or posterior oropharyngeal erythema.  Eyes:     General:        Right eye: No discharge.        Left eye: No discharge.     Extraocular Movements: Extraocular movements intact.     Conjunctiva/sclera: Conjunctivae normal.     Pupils: Pupils are equal, round, and reactive to light.  Neck:     Vascular: No carotid bruit.     Trachea: No tracheal  deviation.  Cardiovascular:     Rate and Rhythm: Normal rate and regular rhythm.     Pulses: Normal pulses.     Heart sounds: Normal heart sounds. No murmur heard.  No friction rub. No gallop.   Pulmonary:     Effort: Pulmonary effort is normal. No respiratory distress.     Breath sounds: Normal breath sounds. No stridor. No wheezing, rhonchi or rales.  Chest:     Chest wall: No tenderness.  Abdominal:     General: Bowel sounds are normal. There is no distension.     Palpations: Abdomen is soft. There is no mass.     Tenderness: There is no abdominal tenderness. There is no right CVA tenderness, left CVA tenderness, guarding or rebound.     Hernia: No hernia is present.  Genitourinary:    Penis: Normal.      Testes: Normal.     Epididymis:     Right: Normal.     Comments: No abnormalities noted Musculoskeletal:        General: No swelling, tenderness, deformity or signs of injury. Normal range of motion.     Right lower leg: No edema.     Left lower leg: No edema.  Lymphadenopathy:     Cervical: No cervical adenopathy.  Skin:    General: Skin is warm and dry.     Capillary Refill: Capillary refill takes less than 2 seconds.     Coloration: Skin is not jaundiced or pale.     Findings: No bruising, erythema, lesion or rash.  Neurological:     General: No focal deficit present.     Mental Status: He is alert and oriented to person, place, and time.     Cranial Nerves: No cranial nerve deficit.     Sensory: No sensory deficit.     Motor: No weakness.     Coordination: Coordination normal.     Gait: Gait normal.     Deep Tendon Reflexes: Reflexes normal.  Psychiatric:  Mood and Affect: Mood normal.        Behavior: Behavior normal.        Thought Content: Thought content normal.        Judgment: Judgment normal.       Assessment & Plan:  1. Routine general medical examination at a health care facility -Needs to continue to work on weight loss through lifestyle  modifications. -Follow-up in 1 year or sooner if needed - CBC with Differential/Platelet; Future - Hemoglobin A1c; Future - Lipid panel; Future - PSA; Future - TSH; Future - CMP with eGFR([facility]); Future  2. Need for influenza vaccination  - Flu Vaccine QUAD 6+ mos PF IM (Fluarix Quad PF)  3. Type 2 diabetes mellitus without complication, without long-term current use of insulin ([place]) -Consider increasing Trulicity and discontinuing one of his oral medications.  Advise follow-up in 3 months - CBC with Differential/Platelet; Future - Hemoglobin A1c; Future - Lipid panel; Future - PSA; Future - TSH; Future - CMP with eGFR([facility]); Future - RPR; Future - CMP with eGFR([facility]) - TSH - PSA - Lipid panel - Hemoglobin A1c - CBC with Differential/Platelet  4. Dyslipidemia -Consider increase in statin - CBC with Differential/Platelet; Future - Hemoglobin A1c; Future - Lipid panel; Future - PSA; Future - TSH; Future - CMP with eGFR([facility]); Future - RPR; Future - CMP with eGFR([facility]) - TSH - PSA - Lipid panel - Hemoglobin A1c - CBC with Differential/Platelet  5. Essential hypertension -No change in medication.  Well-controlled - CBC with Differential/Platelet; Future - Hemoglobin A1c; Future - Lipid panel; Future - PSA; Future - TSH; Future - CMP with eGFR([facility]); Future  6. Penis pain -Unknown cause at this time.  Will prescribe Pyridium to help with discomfort.  Check various STD screens as well as a urinalysis for UTI. - Urine cytology ancillary only; Future - Urinalysis with Reflex Microscopic; Future - phenazopyridine (PYRIDIUM) 100 MG tablet; Take 1 tablet (100 mg total) by mouth 3 (three) times daily as needed for pain.  Dispense: 10 tablet; Refill: 0 - HIV Antibody (routine testing w rflx); Future - HIV Antibody (routine testing w rflx) - RPR - Urinalysis with Reflex Microscopic - Urine cytology ancillary only  7. Seasonal allergies  - fluticasone  (FLONASE) 50 MCG/ACT nasal spray; Place 2 sprays into both nostrils daily.  Dispense: 16 g; Refill: 6  Dorothyann Peng, NP

## 2019-11-24 LAB — COMPLETE METABOLIC PANEL WITH GFR
AG Ratio: 1.3 (calc) (ref 1.0–2.5)
ALT: 16 U/L (ref 9–46)
AST: 15 U/L (ref 10–35)
Albumin: 4.7 g/dL (ref 3.6–5.1)
Alkaline phosphatase (APISO): 53 U/L (ref 35–144)
BUN: 20 mg/dL (ref 7–25)
CO2: 24 mmol/L (ref 20–32)
Calcium: 9.9 mg/dL (ref 8.6–10.3)
Chloride: 99 mmol/L (ref 98–110)
Creat: 1.14 mg/dL (ref 0.70–1.25)
GFR, Est African American: 79 mL/min/{1.73_m2} (ref >=60)
GFR, Est Non African American: 68 mL/min/{1.73_m2} (ref >=60)
Globulin: 3.5 g/dL (calc) (ref 1.9–3.7)
Glucose, Bld: 98 mg/dL (ref 65–99)
Potassium: 4.2 mmol/L (ref 3.5–5.3)
Sodium: 137 mmol/L (ref 135–146)
Total Bilirubin: 0.9 mg/dL (ref 0.2–1.2)
Total Protein: 8.2 g/dL — ABNORMAL HIGH (ref 6.1–8.1)

## 2019-11-24 LAB — HEMOGLOBIN A1C
Hgb A1c MFr Bld: 7.4 % of total Hgb — ABNORMAL HIGH (ref <5.7)
Mean Plasma Glucose: 166 (calc)
eAG (mmol/L): 9.2 (calc)

## 2019-11-24 LAB — CBC WITH DIFFERENTIAL/PLATELET
Absolute Monocytes: 1044 cells/uL — ABNORMAL HIGH (ref 200–950)
Basophils Absolute: 60 cells/uL (ref 0–200)
Basophils Relative: 0.5 %
Eosinophils Absolute: 324 cells/uL (ref 15–500)
Eosinophils Relative: 2.7 %
HCT: 50 % (ref 38.5–50.0)
Hemoglobin: 16.1 g/dL (ref 13.2–17.1)
Lymphs Abs: 3228 cells/uL (ref 850–3900)
MCH: 26.7 pg — ABNORMAL LOW (ref 27.0–33.0)
MCHC: 32.2 g/dL (ref 32.0–36.0)
MCV: 83.1 fL (ref 80.0–100.0)
MPV: 10.3 fL (ref 7.5–12.5)
Monocytes Relative: 8.7 %
Neutro Abs: 7344 cells/uL (ref 1500–7800)
Neutrophils Relative %: 61.2 %
Platelets: 288 10*3/uL (ref 140–400)
RBC: 6.02 10*6/uL — ABNORMAL HIGH (ref 4.20–5.80)
RDW: 13.9 % (ref 11.0–15.0)
Total Lymphocyte: 26.9 %
WBC: 12 10*3/uL — ABNORMAL HIGH (ref 3.8–10.8)

## 2019-11-24 LAB — URINALYSIS, ROUTINE W REFLEX MICROSCOPIC
Bilirubin Urine: NEGATIVE
Hgb urine dipstick: NEGATIVE
Ketones, ur: NEGATIVE
Leukocytes,Ua: NEGATIVE
Nitrite: NEGATIVE
Protein, ur: NEGATIVE
Specific Gravity, Urine: 1.025 (ref 1.001–1.03)
pH: <=5 (ref 5.0–8.0)

## 2019-11-24 LAB — TSH: TSH: 1.16 mIU/L (ref 0.40–4.50)

## 2019-11-24 LAB — PSA: PSA: 1.04 ng/mL (ref <=4.0)

## 2019-11-24 LAB — LIPID PANEL
Cholesterol: 131 mg/dL (ref <200)
HDL: 41 mg/dL (ref >=40)
LDL Cholesterol (Calc): 72 mg/dL (calc)
Non-HDL Cholesterol (Calc): 90 mg/dL (calc) (ref <130)
Total CHOL/HDL Ratio: 3.2 (calc) (ref <5.0)
Triglycerides: 99 mg/dL (ref <150)

## 2019-11-26 LAB — URINE CYTOLOGY ANCILLARY ONLY
Bacterial Vaginitis-Urine: NEGATIVE
Candida Urine: NEGATIVE
Chlamydia: NEGATIVE
Comment: NEGATIVE
Comment: NEGATIVE
Comment: NORMAL
Neisseria Gonorrhea: NEGATIVE
Trichomonas: NEGATIVE

## 2019-11-28 ENCOUNTER — Telehealth: Payer: Self-pay | Admitting: Adult Health

## 2019-11-28 NOTE — Telephone Encounter (Signed)
Spoke to patient and informed him of his labs.  All of his labs were normal except for his A1c increased from 6.6-7.4.  We discussed increasing his Trulicity, but he would like to work on lifestyle modifications first and he will follow-up in 3 months

## 2019-12-15 ENCOUNTER — Other Ambulatory Visit: Payer: Self-pay | Admitting: Adult Health

## 2019-12-15 DIAGNOSIS — E119 Type 2 diabetes mellitus without complications: Secondary | ICD-10-CM

## 2019-12-18 ENCOUNTER — Other Ambulatory Visit: Payer: Self-pay | Admitting: Adult Health

## 2019-12-18 DIAGNOSIS — E119 Type 2 diabetes mellitus without complications: Secondary | ICD-10-CM

## 2019-12-24 ENCOUNTER — Other Ambulatory Visit: Payer: Self-pay | Admitting: Adult Health

## 2019-12-24 DIAGNOSIS — E119 Type 2 diabetes mellitus without complications: Secondary | ICD-10-CM

## 2020-01-19 ENCOUNTER — Other Ambulatory Visit: Payer: Self-pay | Admitting: Adult Health

## 2020-01-19 DIAGNOSIS — E119 Type 2 diabetes mellitus without complications: Secondary | ICD-10-CM

## 2020-02-21 ENCOUNTER — Other Ambulatory Visit: Payer: Self-pay | Admitting: Adult Health

## 2020-02-21 DIAGNOSIS — E119 Type 2 diabetes mellitus without complications: Secondary | ICD-10-CM

## 2020-02-21 NOTE — Telephone Encounter (Signed)
Due for 3 month follow up

## 2020-03-22 ENCOUNTER — Other Ambulatory Visit: Payer: Self-pay | Admitting: Adult Health

## 2020-03-22 DIAGNOSIS — E119 Type 2 diabetes mellitus without complications: Secondary | ICD-10-CM

## 2020-04-19 ENCOUNTER — Other Ambulatory Visit: Payer: Self-pay | Admitting: Adult Health

## 2020-04-19 DIAGNOSIS — E119 Type 2 diabetes mellitus without complications: Secondary | ICD-10-CM

## 2020-04-19 DIAGNOSIS — E785 Hyperlipidemia, unspecified: Secondary | ICD-10-CM

## 2020-04-28 ENCOUNTER — Other Ambulatory Visit: Payer: Self-pay

## 2020-04-29 ENCOUNTER — Ambulatory Visit (INDEPENDENT_AMBULATORY_CARE_PROVIDER_SITE_OTHER): Payer: 59 | Admitting: Adult Health

## 2020-04-29 ENCOUNTER — Encounter: Payer: Self-pay | Admitting: Adult Health

## 2020-04-29 VITALS — BP 102/70 | HR 47 | Temp 98.5°F | Ht 76.0 in | Wt 233.0 lb

## 2020-04-29 DIAGNOSIS — E119 Type 2 diabetes mellitus without complications: Secondary | ICD-10-CM | POA: Diagnosis not present

## 2020-04-29 DIAGNOSIS — I10 Essential (primary) hypertension: Secondary | ICD-10-CM

## 2020-04-29 MED ORDER — GLIPIZIDE ER 10 MG PO TB24
10.0000 mg | ORAL_TABLET | Freq: Every morning | ORAL | 0 refills | Status: DC
Start: 2020-04-29 — End: 2020-08-26

## 2020-04-29 MED ORDER — TRULICITY 1.5 MG/0.5ML ~~LOC~~ SOAJ: 1.5000 mg | SUBCUTANEOUS | 0 refills | Status: DC

## 2020-04-29 NOTE — Patient Instructions (Signed)
It was great seeing you today  I am going to increase your Trulicity to 1.5 mg   I have sent in your glipizide   I want you to follow up in three months

## 2020-04-29 NOTE — Progress Notes (Signed)
Subjective:    Patient ID: Christopher Serrano, male    DOB: 1955-08-19, 65 y.o.   MRN: [id number]  HPI 65 year old male who  has a past medical history of Allergy, ASTHMA (07/25/2006), Depression, DIABETES MELLITUS, TYPE II (07/25/2006), HYPERLIPIDEMIA (01/07/2008), and HYPERTENSION (07/25/2006).   He presents to the office today for follow up regarding DM and HTN  DM -currently prescribed Farxiga 10 mg, Trulicity 3.47 mg, and glipizide 10 mg daily, and Metformin 1000 mg twice daily.  When he was last seen in October 2021 his A1c had increased from 6.6-7.4.  At this time he reported checking his blood sugars at home they were usually between 110 and 140.  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  Reports that he has been stressed out with this and has been drinking a lot of orange juice and sweet tea  He was seen at [facility] emergency room 3 days ago where he presented for glycemia x3 days.  He did endorse polydipsia, polyuria, and blurred vision at this time.  Glucose on presentation was 874.  Labs did not show DKA.  He was corrected with 2 L normal saline and insulin.  He was discharged home and advised to follow-up with his PCP.  He has been out of Trulicity as well as glipizide but has [place] and Metformin that was sent in last month.  He has stopped drinking sweet tea and orange juice.  Blood sugars this morning in the 400s.  He is no longer having polyuria, polydipsia, or blurred vision  Lab Results  Component Value Date   HGBA1C 7.4 (H) 11/23/2019   Wt Readings from Last 3 Encounters:  04/29/20 233 lb (105.7 kg)  11/23/19 253 lb 3.2 oz (114.9 kg)  07/27/19 253 lb (114.8 kg)     HTN -currently prescribed HCTZ/lisinopril 20-12.5 mg.  He denies dizziness, lightheadedness, chest pain, or shortness of breath. BP Readings from Last 3  Encounters:  04/29/20 102/70  11/23/19 110/72  07/27/19 116/80     Review of Systems See HPI   Past Medical History:  Diagnosis Date  . Allergy   . ASTHMA 07/25/2006  . Depression   . DIABETES MELLITUS, TYPE II 07/25/2006  . HYPERLIPIDEMIA 01/07/2008  . HYPERTENSION 07/25/2006    Social History   Socioeconomic History  . Marital status: Married    Spouse name: Not on file  . Number of children: Not on file  . Years of education: Not on file  . Highest education level: Not on file  Occupational History  . Not on file  Tobacco Use  . Smoking status: Never Smoker  . Smokeless tobacco: Never Used  Substance and Sexual Activity  . Alcohol use: Yes    Comment: every 6 -7 mths  . Drug use: No  . Sexual activity: Not on file  Other Topics Concern  . Not on file  Social History Narrative   Patent attorney    Social Determinants of Health   Financial Resource Strain: Not on file  Food Insecurity: Not on file  Transportation Needs: Not on file  Physical Activity: Not on file  Stress: Not on file  Social Connections: Not on file  Intimate Partner Violence: Not on file    Past Surgical History:  Procedure Laterality Date  . SINUS IRRIGATION  Family History  Problem Relation Age of Onset  . Hypertension Mother   . Dementia Father   . Diabetes Brother   . Colon cancer Neg Hx   . Esophageal cancer Neg Hx   . Pancreatic cancer Neg Hx   . Rectal cancer Neg Hx   . Stomach cancer Neg Hx     Allergies  Allergen Reactions  . Aleve [Naproxen] Shortness Of Breath  . Aspirin   . Ibuprofen     REACTION: sob/tightness    Current Outpatient Medications on File Prior to Visit  Medication Sig Dispense Refill  . FARXIGA 10 MG TABS tablet TAKE 1 TABLET(10 MG) BY MOUTH DAILY 90 tablet 0  . fluticasone (FLONASE) 50 MCG/ACT nasal spray Place 2 sprays into both nostrils daily. 16 g 6  . glipiZIDE (GLUCOTROL XL) 10 MG 24 hr tablet TAKE 1 TABLET BY MOUTH DAILY IN THE  MORNING 90 tablet 0  . lisinopril-hydrochlorothiazide (ZESTORETIC) 20-12.5 MG tablet Take 2 tablets by mouth daily. 180 tablet 2  . metFORMIN (GLUCOPHAGE) 1000 MG tablet TAKE 1 TABLET(1000 MG) BY MOUTH TWICE DAILY WITH A MEAL 180 tablet 0  . simvastatin (ZOCOR) 40 MG tablet TAKE 1 TABLET(40 MG) BY MOUTH DAILY AT 6 PM 90 tablet 0   No current facility-administered medications on file prior to visit.    BP 102/70 (BP Location: Left Arm, Patient Position: Sitting, Cuff Size: Large)   Pulse (!) 47   Temp 98.5 F (36.9 C) (Oral)   Ht 6\' 4"  (1.93 m)   Wt 233 lb (105.7 kg)   BMI 28.36 kg/m       Objective:   Physical Exam Vitals and nursing note reviewed.  Constitutional:      Appearance: Normal appearance.  Skin:    General: Skin is warm and dry.     Capillary Refill: Capillary refill takes less than 2 seconds.  Neurological:     General: No focal deficit present.     Mental Status: He is alert and oriented to person, place, and time.  Psychiatric:        Mood and Affect: Mood normal.        Behavior: Behavior normal.        Thought Content: Thought content normal.        Judgment: Judgment normal.       Assessment & Plan:  1. Type 2 diabetes mellitus without complication, without long-term current use of insulin ([place]) - Will restart on all of his oral medication as well as weekly injectable.  Continue to stay away from sugary drinks.  Heart healthy diet.  He will follow-up in 3 months unless his blood sugars continue to be elevated despite being on all of his regular medication and then will follow up sooner  - Hemoglobin A1c; Future - Basic Metabolic Panel; Future - glipiZIDE (GLUCOTROL XL) 10 MG 24 hr tablet; Take 1 tablet (10 mg total) by mouth every morning.  Dispense: 90 tablet; Refill: 0 - Dulaglutide (TRULICITY) 1.5 MO/2.9UT SOPN; Inject 1.5 mg into the skin once a week.  Dispense: 6 mL; Refill: 0 - Basic Metabolic Panel - Hemoglobin A1c  2. Essential hypertension -  BP well controlled.  - No change in medications   Dorothyann Peng, NP

## 2020-04-30 ENCOUNTER — Telehealth: Payer: Self-pay | Admitting: Adult Health

## 2020-04-30 LAB — BASIC METABOLIC PANEL
BUN: 45 mg/dL — ABNORMAL HIGH (ref 6–23)
CO2: 19 mEq/L (ref 19–32)
Calcium: 10.2 mg/dL (ref 8.4–10.5)
Chloride: 91 mEq/L — ABNORMAL LOW (ref 96–112)
Creatinine, Ser: 1.65 mg/dL — ABNORMAL HIGH (ref 0.40–1.50)
GFR: 43.82 mL/min — ABNORMAL LOW (ref >60.00)
Glucose, Bld: 351 mg/dL — ABNORMAL HIGH (ref 70–99)
Potassium: 4.9 mEq/L (ref 3.5–5.1)
Sodium: 130 mEq/L — ABNORMAL LOW (ref 135–145)

## 2020-04-30 LAB — HEMOGLOBIN A1C: Hgb A1c MFr Bld: 13.9 % — ABNORMAL HIGH (ref 4.6–6.5)

## 2020-04-30 NOTE — Telephone Encounter (Signed)
Updated patient on labs.  A1c has increased to 13.9.  He did restart all of his medications yesterday.  If he abides by a diabetic diet and stays away from sugary drinks, takes all of his medications and I have no doubt that his kidney function will improve and A1c will come down.  He will follow-up with me in 2 weeks if blood sugars are not trending down and will do a diabetic follow-up in 3 months.  He knows to contact me if he needs anything in the meantime

## 2020-05-08 ENCOUNTER — Ambulatory Visit (INDEPENDENT_AMBULATORY_CARE_PROVIDER_SITE_OTHER): Payer: 59 | Admitting: Adult Health

## 2020-05-08 ENCOUNTER — Encounter: Payer: Self-pay | Admitting: Adult Health

## 2020-05-08 ENCOUNTER — Other Ambulatory Visit: Payer: Self-pay

## 2020-05-08 VITALS — BP 118/80 | HR 86 | Temp 98.5°F | Ht 76.0 in | Wt 237.6 lb

## 2020-05-08 DIAGNOSIS — E119 Type 2 diabetes mellitus without complications: Secondary | ICD-10-CM

## 2020-05-08 DIAGNOSIS — I10 Essential (primary) hypertension: Secondary | ICD-10-CM

## 2020-05-08 DIAGNOSIS — N289 Disorder of kidney and ureter, unspecified: Secondary | ICD-10-CM | POA: Diagnosis not present

## 2020-05-08 LAB — BASIC METABOLIC PANEL
BUN: 18 mg/dL (ref 6–23)
CO2: 27 mEq/L (ref 19–32)
Calcium: 9.3 mg/dL (ref 8.4–10.5)
Chloride: 98 mEq/L (ref 96–112)
Creatinine, Ser: 1.07 mg/dL (ref 0.40–1.50)
GFR: 73.68 mL/min (ref >60.00)
Glucose, Bld: 190 mg/dL — ABNORMAL HIGH (ref 70–99)
Potassium: 4.3 mEq/L (ref 3.5–5.1)
Sodium: 134 mEq/L — ABNORMAL LOW (ref 135–145)

## 2020-05-08 NOTE — Progress Notes (Signed)
Subjective:    Patient ID: Christopher Serrano, male    DOB: [birth year], 65 y.o.   MRN: [id number]  HPI 65 year old male who  has a past medical history of Allergy, ASTHMA (07/25/2006), Depression, DIABETES MELLITUS, TYPE II (07/25/2006), HYPERLIPIDEMIA (01/07/2008), and HYPERTENSION (07/25/2006).  Presents to the office today for follow-up after recent hospital admission  He was admitted on 05/02/2020 and discharged on 05/04/2020.  He presented to the ER with complaints of nausea and vomiting for at least 1 week in duration.  He was recently seen by this writer on 04/29/2020 after he was seen in the emergency room for uncontrolled diabetes.  Was placed on all of his regular diabetes medications as he had not been taking them.  During this hospital admission he was placed on IV fluids, antiemetics as needed.  Was started on Lantus and correction scale insulin.  His lisinopril/hydrochlorothiazide was placed on on hold in view of acute kidney injury and borderline low blood pressure. He was also advised to stop all of his other diabetic medications until he was seen by his PCP  At time of admission his creatinine was 2.98, thankfully prior to discharge it normalized.  His renal ultrasound was negative. A1c was 14.1  BP Readings from Last 3 Encounters:  05/08/20 118/80  04/29/20 102/70  11/23/19 110/72   Today he reports "I feel really good".  He has only had to use his Lantus 6 units daily and his blood sugars have been between 130 and 140 on a consistent basis.  He has not seen any blood sugars of 170.  He has no longer having episodes of nausea and vomiting.  He has been staying away from sugars and carbs and drinking a lot of water.  He has no complaints today except that his insulin cost him about $100.   Review of Systems See HPI   Past Medical History:  Diagnosis Date  . Allergy   . ASTHMA 07/25/2006  . Depression   . DIABETES MELLITUS, TYPE II 07/25/2006  . HYPERLIPIDEMIA 01/07/2008  .  HYPERTENSION 07/25/2006    Social History   Socioeconomic History  . Marital status: Married    Spouse name: Not on file  . Number of children: Not on file  . Years of education: Not on file  . Highest education level: Not on file  Occupational History  . Not on file  Tobacco Use  . Smoking status: Never Smoker  . Smokeless tobacco: Never Used  Substance and Sexual Activity  . Alcohol use: Yes    Comment: every 6 -7 mths  . Drug use: No  . Sexual activity: Not on file  Other Topics Concern  . Not on file  Social History Narrative   Patent attorney    Social Determinants of Health   Financial Resource Strain: Not on file  Food Insecurity: Not on file  Transportation Needs: Not on file  Physical Activity: Not on file  Stress: Not on file  Social Connections: Not on file  Intimate Partner Violence: Not on file    Past Surgical History:  Procedure Laterality Date  . SINUS IRRIGATION      Family History  Problem Relation Age of Onset  . Hypertension Mother   . Dementia Father   . Diabetes Brother   . Colon cancer Neg Hx   . Esophageal cancer Neg Hx   . Pancreatic cancer Neg Hx   . Rectal cancer Neg Hx   . Stomach cancer  Neg Hx     Allergies  Allergen Reactions  . Aleve [Naproxen] Shortness Of Breath  . Aspirin   . Ibuprofen     REACTION: sob/tightness    Current Outpatient Medications on File Prior to Visit  Medication Sig Dispense Refill  . Dulaglutide (TRULICITY) 1.5 PF/7.9KW SOPN Inject 1.5 mg into the skin once a week. 6 mL 0  . FARXIGA 10 MG TABS tablet TAKE 1 TABLET(10 MG) BY MOUTH DAILY 90 tablet 0  . fluticasone (FLONASE) 50 MCG/ACT nasal spray Place 2 sprays into both nostrils daily. 16 g 6  . glipiZIDE (GLUCOTROL XL) 10 MG 24 hr tablet Take 1 tablet (10 mg total) by mouth every morning. 90 tablet 0  . lisinopril-hydrochlorothiazide (ZESTORETIC) 20-12.5 MG tablet Take 2 tablets by mouth daily. 180 tablet 2  . metFORMIN (GLUCOPHAGE) 1000 MG  tablet TAKE 1 TABLET(1000 MG) BY MOUTH TWICE DAILY WITH A MEAL 180 tablet 0  . simvastatin (ZOCOR) 40 MG tablet TAKE 1 TABLET(40 MG) BY MOUTH DAILY AT 6 PM 90 tablet 0   No current facility-administered medications on file prior to visit.    BP 118/80 (BP Location: Right Arm, Patient Position: Sitting, Cuff Size: Large)   Pulse 86   Temp 98.5 F (36.9 C) (Oral)   Ht 6\' 4"  (1.93 m)   Wt 237 lb 9.6 oz (107.8 kg)   SpO2 97%   BMI 28.92 kg/m     Past Medical History:  Diagnosis Date  . Allergy   . ASTHMA 07/25/2006  . Depression   . DIABETES MELLITUS, TYPE II 07/25/2006  . HYPERLIPIDEMIA 01/07/2008  . HYPERTENSION 07/25/2006    Social History   Socioeconomic History  . Marital status: Married    Spouse name: Not on file  . Number of children: Not on file  . Years of education: Not on file  . Highest education level: Not on file  Occupational History  . Not on file  Tobacco Use  . Smoking status: Never Smoker  . Smokeless tobacco: Never Used  Substance and Sexual Activity  . Alcohol use: Yes    Comment: every 6 -7 mths  . Drug use: No  . Sexual activity: Not on file  Other Topics Concern  . Not on file  Social History Narrative   Patent attorney    Social Determinants of Health   Financial Resource Strain: Not on file  Food Insecurity: Not on file  Transportation Needs: Not on file  Physical Activity: Not on file  Stress: Not on file  Social Connections: Not on file  Intimate Partner Violence: Not on file    Past Surgical History:  Procedure Laterality Date  . SINUS IRRIGATION      Family History  Problem Relation Age of Onset  . Hypertension Mother   . Dementia Father   . Diabetes Brother   . Colon cancer Neg Hx   . Esophageal cancer Neg Hx   . Pancreatic cancer Neg Hx   . Rectal cancer Neg Hx   . Stomach cancer Neg Hx     Allergies  Allergen Reactions  . Aleve [Naproxen] Shortness Of Breath  . Aspirin   . Ibuprofen     REACTION:  sob/tightness    Current Outpatient Medications on File Prior to Visit  Medication Sig Dispense Refill  . Dulaglutide (TRULICITY) 1.5 IO/9.7DZ SOPN Inject 1.5 mg into the skin once a week. 6 mL 0  . FARXIGA 10 MG TABS tablet TAKE 1 TABLET(10 MG)  BY MOUTH DAILY 90 tablet 0  . fluticasone (FLONASE) 50 MCG/ACT nasal spray Place 2 sprays into both nostrils daily. 16 g 6  . glipiZIDE (GLUCOTROL XL) 10 MG 24 hr tablet Take 1 tablet (10 mg total) by mouth every morning. 90 tablet 0  . lisinopril-hydrochlorothiazide (ZESTORETIC) 20-12.5 MG tablet Take 2 tablets by mouth daily. 180 tablet 2  . metFORMIN (GLUCOPHAGE) 1000 MG tablet TAKE 1 TABLET(1000 MG) BY MOUTH TWICE DAILY WITH A MEAL 180 tablet 0  . simvastatin (ZOCOR) 40 MG tablet TAKE 1 TABLET(40 MG) BY MOUTH DAILY AT 6 PM 90 tablet 0   No current facility-administered medications on file prior to visit.    There were no vitals taken for this visit.      Objective:   Physical Exam Vitals and nursing note reviewed.  Constitutional:      Appearance: Normal appearance.  Cardiovascular:     Rate and Rhythm: Normal rate and regular rhythm.     Pulses: Normal pulses.     Heart sounds: Normal heart sounds.  Pulmonary:     Effort: Pulmonary effort is normal.     Breath sounds: Normal breath sounds.  Musculoskeletal:        General: Normal range of motion.  Skin:    General: Skin is warm and dry.     Capillary Refill: Capillary refill takes less than 2 seconds.  Neurological:     General: No focal deficit present.     Mental Status: He is alert and oriented to person, place, and time.  Psychiatric:        Mood and Affect: Mood normal.        Behavior: Behavior normal.        Thought Content: Thought content normal.        Judgment: Judgment normal.       Assessment & Plan:  1. Type 2 diabetes mellitus without complication, without long-term current use of insulin ([place]) - Reviewed notes in Care [facility] from [facility] -We will hold his oral medication as he has been very well controlled with Lantus 6 units.  We will have him follow up at the 1 month and 81-month mark.  We will switch to [place] when he is ready for refill. - Basic Metabolic Panel - insulin glargine (LANTUS) 100 UNIT/ML injection; Inject 6 Units into the skin daily.  2. Essential hypertension -At goal without medication today.  He was advised to continue to monitor at home, if blood pressure starts to go up and he will let me know - Basic Metabolic Panel  3. Function kidney decreased - Basic Metabolic Panel   Dorothyann Peng, NP

## 2020-05-08 NOTE — Patient Instructions (Addendum)
Do not take any medication besides your Simvastatin and 6 units of insulin   If your notice your blood sugar are trending up, please let me know   I want to see you in one month

## 2020-08-25 ENCOUNTER — Other Ambulatory Visit: Payer: Self-pay

## 2020-08-26 ENCOUNTER — Encounter: Payer: Self-pay | Admitting: Adult Health

## 2020-08-26 ENCOUNTER — Other Ambulatory Visit: Payer: Self-pay | Admitting: Adult Health

## 2020-08-26 ENCOUNTER — Ambulatory Visit (INDEPENDENT_AMBULATORY_CARE_PROVIDER_SITE_OTHER): Payer: 59 | Admitting: Adult Health

## 2020-08-26 VITALS — BP 120/82 | HR 91 | Temp 99.0°F | Ht 76.0 in | Wt 225.0 lb

## 2020-08-26 DIAGNOSIS — E119 Type 2 diabetes mellitus without complications: Secondary | ICD-10-CM | POA: Diagnosis not present

## 2020-08-26 DIAGNOSIS — E785 Hyperlipidemia, unspecified: Secondary | ICD-10-CM | POA: Diagnosis not present

## 2020-08-26 LAB — HEMOGLOBIN A1C: Hgb A1c MFr Bld: 15.6 % — ABNORMAL HIGH (ref 4.6–6.5)

## 2020-08-26 LAB — BASIC METABOLIC PANEL
BUN: 16 mg/dL (ref 6–23)
CO2: 26 mEq/L (ref 19–32)
Calcium: 9.8 mg/dL (ref 8.4–10.5)
Chloride: 98 mEq/L (ref 96–112)
Creatinine, Ser: 1.17 mg/dL (ref 0.40–1.50)
GFR: 66.05 mL/min (ref >60.00)
Glucose, Bld: 303 mg/dL — ABNORMAL HIGH (ref 70–99)
Potassium: 4.2 mEq/L (ref 3.5–5.1)
Sodium: 135 mEq/L (ref 135–145)

## 2020-08-26 MED ORDER — SIMVASTATIN 40 MG PO TABS: 40.0000 mg | ORAL_TABLET | Freq: Every day | ORAL | 3 refills | Status: DC

## 2020-08-26 MED ORDER — BASAGLAR KWIKPEN 100 UNIT/ML ~~LOC~~ SOPN: 12.0000 [IU] | PEN_INJECTOR | Freq: Every day | SUBCUTANEOUS | 0 refills | Status: DC

## 2020-08-26 MED ORDER — GLIPIZIDE ER 10 MG PO TB24: 10.0000 mg | ORAL_TABLET | Freq: Every morning | ORAL | 0 refills | Status: DC

## 2020-08-26 MED ORDER — TRULICITY 0.75 MG/0.5ML ~~LOC~~ SOAJ: 0.7500 mg | SUBCUTANEOUS | 0 refills | Status: DC

## 2020-08-26 NOTE — Progress Notes (Signed)
Subjective:    Patient ID: Christopher Serrano, male    DOB: [birth year], 65 y.o.   MRN: [id number]  HPI 65 year old male who  has a past medical history of Allergy, ASTHMA (07/25/2006), Depression, DIABETES MELLITUS, TYPE II (07/25/2006), HYPERLIPIDEMIA (01/07/2008), and HYPERTENSION (07/25/2006).  Presents to the office today for 86-monthfollow-up regarding diabetes  DM -currently managed with insulin glargine 6 units daily. He reports at home he is having trouble keeping his blood sugars below 300. When he was last seen his BS were in the 130-140's. At this time he had been seen in the ER for uncontrolled diabetes. At which time his oral medication was d/c and he was placed on insulin. He has been exercising , drinking more water, and has cut out sodas and carbs. Denies polydipsia and polyuria    Review of Systems See HPI   Past Medical History:  Diagnosis Date   Allergy    ASTHMA 07/25/2006   Depression    DIABETES MELLITUS, TYPE II 07/25/2006   HYPERLIPIDEMIA 01/07/2008   HYPERTENSION 07/25/2006    Social History   Socioeconomic History   Marital status: Married    Spouse name: Not on file   Number of children: Not on file   Years of education: Not on file   Highest education level: Not on file  Occupational History   Not on file  Tobacco Use   Smoking status: Never   Smokeless tobacco: Never  Substance and Sexual Activity   Alcohol use: Yes    Comment: every 6 -7 mths   Drug use: No   Sexual activity: Not on file  Other Topics Concern   Not on file  Social History Narrative   FPatent attorney   Social Determinants of Health   Financial Resource Strain: Not on file  Food Insecurity: Not on file  Transportation Needs: Not on file  Physical Activity: Not on file  Stress: Not on file  Social Connections: Not on file  Intimate Partner Violence: Not on file    Past Surgical History:  Procedure Laterality Date   SINUS IRRIGATION      Family History  Problem  Relation Age of Onset   Hypertension Mother    Dementia Father    Diabetes Brother    Colon cancer Neg Hx    Esophageal cancer Neg Hx    Pancreatic cancer Neg Hx    Rectal cancer Neg Hx    Stomach cancer Neg Hx     Allergies  Allergen Reactions   Aleve [Naproxen] Shortness Of Breath   Aspirin    Ibuprofen     REACTION: sob/tightness    Current Outpatient Medications on File Prior to Visit  Medication Sig Dispense Refill   FARXIGA 10 MG TABS tablet TAKE 1 TABLET(10 MG) BY MOUTH DAILY (Patient not taking: No sig reported) 90 tablet 0   fluticasone (FLONASE) 50 MCG/ACT nasal spray Place 2 sprays into both nostrils daily. (Patient not taking: Reported on 08/26/2020) 16 g 6   glipiZIDE (GLUCOTROL XL) 10 MG 24 hr tablet Take 1 tablet (10 mg total) by mouth every morning. (Patient not taking: No sig reported) 90 tablet 0   lisinopril-hydrochlorothiazide (ZESTORETIC) 20-12.5 MG tablet Take 2 tablets by mouth daily. (Patient not taking: No sig reported) 180 tablet 2   metFORMIN (GLUCOPHAGE) 1000 MG tablet TAKE 1 TABLET(1000 MG) BY MOUTH TWICE DAILY WITH A MEAL (Patient not taking: No sig reported) 180 tablet 0   simvastatin (ZOCOR)  40 MG tablet TAKE 1 TABLET(40 MG) BY MOUTH DAILY AT 6 PM (Patient not taking: Reported on 08/26/2020) 90 tablet 0   [DISCONTINUED] insulin glargine (LANTUS) 100 UNIT/ML injection Inject 6 Units into the skin daily. (Patient not taking: Reported on 08/26/2020)     No current facility-administered medications on file prior to visit.    BP 120/82   Pulse 91   Temp 99 F (37.2 C) (Oral)   Ht '6\' 4"'$  (1.93 m)   Wt 225 lb (102.1 kg)   SpO2 92%   BMI 27.39 kg/m        Objective:   Physical Exam Vitals and nursing note reviewed.  Constitutional:      Appearance: Normal appearance.  Cardiovascular:     Rate and Rhythm: Normal rate and regular rhythm.     Pulses: Normal pulses.     Heart sounds: Normal heart sounds.  Skin:    General: Skin is warm and dry.      Capillary Refill: Capillary refill takes less than 2 seconds.  Neurological:     General: No focal deficit present.     Mental Status: He is alert and oriented to person, place, and time.  Psychiatric:        Mood and Affect: Mood normal.        Behavior: Behavior normal.        Thought Content: Thought content normal.        Judgment: Judgment normal.          Assessment & Plan:   1. Type 2 diabetes mellitus without complication, without long-term current use of insulin ([facility]) - Increase Basaglar to 12 units and add Trulicity [id number] mg. May increase insulin further depending on A1c  - Insulin Glargine (BASAGLAR KWIKPEN) 100 UNIT/ML; Inject 12 Units into the skin daily.  Dispense: 12 mL; Refill: 0 - Hemoglobin A1c; Future - Basic Metabolic Panel; Future - Dulaglutide (TRULICITY) A999333 0000000 SOPN; Inject 0.75 mg into the skin once a week.  Dispense: 6 mL; Refill: 0  2. Dyslipidemia  - simvastatin (ZOCOR) 40 MG tablet; Take 1 tablet (40 mg total) by mouth daily at 6 PM.  Dispense: 90 tablet; Refill: 3  Dorothyann Peng, NP

## 2020-08-26 NOTE — Addendum Note (Signed)
Addended by: Amanda Cockayne on: 08/26/2020 02:00 PM   Modules accepted: Orders

## 2020-08-26 NOTE — Patient Instructions (Signed)
Increase insulin to 12 units daily   I have added back Trulicity   I will follow up with you regarding your blood work

## 2020-09-11 ENCOUNTER — Telehealth: Payer: Self-pay | Admitting: Adult Health

## 2020-09-11 NOTE — Telephone Encounter (Signed)
Spoke to patient to see how his blood sugars were trending. He reports that he is feeling good but his blood sugars continue to be in the low 300's consistently.   I will have him increase trulicity from 12 units to 20 units daily.   He will follow up with me in acouple of weeks to let me know how he is doing   Dorothyann Peng, NP

## 2020-11-06 ENCOUNTER — Other Ambulatory Visit: Payer: Self-pay | Admitting: Adult Health

## 2020-11-06 DIAGNOSIS — E119 Type 2 diabetes mellitus without complications: Secondary | ICD-10-CM

## 2020-12-11 ENCOUNTER — Ambulatory Visit (INDEPENDENT_AMBULATORY_CARE_PROVIDER_SITE_OTHER): Payer: 59 | Admitting: Adult Health

## 2020-12-11 ENCOUNTER — Encounter: Payer: Self-pay | Admitting: Adult Health

## 2020-12-11 DIAGNOSIS — E119 Type 2 diabetes mellitus without complications: Secondary | ICD-10-CM

## 2020-12-11 MED ORDER — GLIPIZIDE ER 10 MG PO TB24: 10.0000 mg | ORAL_TABLET | Freq: Every morning | ORAL | 0 refills | Status: DC

## 2020-12-11 NOTE — Progress Notes (Signed)
Subjective:    Patient ID: Christopher Serrano, male    DOB: May 29, 1955, 65 y.o.   MRN: [id number]  HPI  66 year old male who  has a past medical history of Allergy, ASTHMA (07/25/2006), Depression, DIABETES MELLITUS, TYPE II (07/25/2006), HYPERLIPIDEMIA (01/07/2008), and HYPERTENSION (07/25/2006).  He presents to the office today for three month follow up regarding DM   He Maintained on Basaglar 20  units daily, glipizide 10 mg XR daily, and Trulicity 6.71 mg weekly.  He does report that he is no longer drinking sodas or eating sweets and has been cutting back on carbs.  He has not been exercising due to feeling exhausted when he gets home from work.  Has been monitoring his blood sugars on a routine basis and reports his blood sugars are improved down from the 300s into the low 200s.  He does report feeling much better.  Lab Results  Component Value Date   HGBA1C 15.6 (H) 08/26/2020    Review of Systems See HPI   Past Medical History:  Diagnosis Date   Allergy    ASTHMA 07/25/2006   Depression    DIABETES MELLITUS, TYPE II 07/25/2006   HYPERLIPIDEMIA 01/07/2008   HYPERTENSION 07/25/2006    Social History   Socioeconomic History   Marital status: Married    Spouse name: Not on file   Number of children: Not on file   Years of education: Not on file   Highest education level: Not on file  Occupational History   Not on file  Tobacco Use   Smoking status: Never   Smokeless tobacco: Never  Substance and Sexual Activity   Alcohol use: Yes    Comment: every 6 -7 mths   Drug use: No   Sexual activity: Not on file  Other Topics Concern   Not on file  Social History Narrative   Patent attorney    Social Determinants of Health   Financial Resource Strain: Not on file  Food Insecurity: Not on file  Transportation Needs: Not on file  Physical Activity: Not on file  Stress: Not on file  Social Connections: Not on file  Intimate Partner Violence: Not on file    Past  Surgical History:  Procedure Laterality Date   SINUS IRRIGATION      Family History  Problem Relation Age of Onset   Hypertension Mother    Dementia Father    Diabetes Brother    Colon cancer Neg Hx    Esophageal cancer Neg Hx    Pancreatic cancer Neg Hx    Rectal cancer Neg Hx    Stomach cancer Neg Hx     Allergies  Allergen Reactions   Aleve [Naproxen] Shortness Of Breath   Aspirin    Ibuprofen     REACTION: sob/tightness    Current Outpatient Medications on File Prior to Visit  Medication Sig Dispense Refill   FARXIGA 10 MG TABS tablet TAKE 1 TABLET(10 MG) BY MOUTH DAILY 90 tablet 0   fluticasone (FLONASE) 50 MCG/ACT nasal spray Place 2 sprays into both nostrils daily. 16 g 6   glipiZIDE (GLUCOTROL XL) 10 MG 24 hr tablet Take 1 tablet (10 mg total) by mouth every morning. 90 tablet 0   metFORMIN (GLUCOPHAGE) 1000 MG tablet TAKE 1 TABLET(1000 MG) BY MOUTH TWICE DAILY WITH A MEAL 180 tablet 0   simvastatin (ZOCOR) 40 MG tablet Take 1 tablet (40 mg total) by mouth daily at 6 PM. 90 tablet 3  TRULICITY 3.21 YY/4.8GN SOPN ADMINISTER 0.75 MG UNDER THE SKIN 1 TIME A WEEK 6 mL 0   Insulin Glargine (BASAGLAR KWIKPEN) 100 UNIT/ML Inject 12 Units into the skin daily. (Patient taking differently: Inject 20 Units into the skin daily.) 12 mL 0   No current facility-administered medications on file prior to visit.    BP 100/72   Pulse 96   Temp 98.6 F (37 C) (Oral)   Ht 6\' 4"  (1.93 m)   Wt 243 lb (110.2 kg)   SpO2 98%   BMI 29.58 kg/m   Wt Readings from Last 3 Encounters:  12/11/20 243 lb (110.2 kg)  08/26/20 225 lb (102.1 kg)  05/08/20 237 lb 9.6 oz (107.8 kg)       Objective:   Physical Exam Vitals and nursing note reviewed.  Constitutional:      Appearance: Normal appearance.  Cardiovascular:     Rate and Rhythm: Normal rate and regular rhythm.     Pulses: Normal pulses.     Heart sounds: Normal heart sounds.  Pulmonary:     Effort: Pulmonary effort is  normal.     Breath sounds: Normal breath sounds.  Skin:    General: Skin is warm and dry.  Neurological:     General: No focal deficit present.     Mental Status: He is alert and oriented to person, place, and time.  Psychiatric:        Mood and Affect: Mood normal.        Behavior: Behavior normal.        Thought Content: Thought content normal.        Judgment: Judgment normal.      Assessment & Plan:  1. Type 2 diabetes mellitus without complication, without long-term current use of insulin ([facility]) - Check A1c today and will follow up tomorrow. Likely increase trulicity. Needs to start exercising. Follow up  in three months  - glipiZIDE (GLUCOTROL XL) 10 MG 24 hr tablet; Take 1 tablet (10 mg total) by mouth every morning.  Dispense: 90 tablet; Refill: 0 - Hemoglobin A1c; Future - Basic Metabolic Panel; Future  Dorothyann Peng, NP

## 2020-12-12 ENCOUNTER — Telehealth: Payer: Self-pay | Admitting: Adult Health

## 2020-12-12 DIAGNOSIS — E119 Type 2 diabetes mellitus without complications: Secondary | ICD-10-CM

## 2020-12-12 LAB — BASIC METABOLIC PANEL
BUN: 11 mg/dL (ref 6–23)
CO2: 28 mEq/L (ref 19–32)
Calcium: 9.6 mg/dL (ref 8.4–10.5)
Chloride: 102 mEq/L (ref 96–112)
Creatinine, Ser: 0.96 mg/dL (ref 0.40–1.50)
GFR: 83.57 mL/min (ref >60.00)
Glucose, Bld: 75 mg/dL (ref 70–99)
Potassium: 3.5 mEq/L (ref 3.5–5.1)
Sodium: 140 mEq/L (ref 135–145)

## 2020-12-12 LAB — HEMOGLOBIN A1C: Hgb A1c MFr Bld: 7.9 % — ABNORMAL HIGH (ref 4.6–6.5)

## 2020-12-12 MED ORDER — BASAGLAR KWIKPEN 100 UNIT/ML ~~LOC~~ SOPN: 20.0000 [IU] | PEN_INJECTOR | Freq: Every day | SUBCUTANEOUS | 0 refills | Status: DC

## 2020-12-12 MED ORDER — METFORMIN HCL 1000 MG PO TABS: ORAL_TABLET | ORAL | 0 refills | Status: DC

## 2020-12-12 MED ORDER — TRULICITY 1.5 MG/0.5ML ~~LOC~~ SOAJ: 1.5000 mg | SUBCUTANEOUS | 0 refills | Status: DC

## 2020-12-12 NOTE — Telephone Encounter (Signed)
Updated patient on lab work.   His A1c dropped from 15.6 to 7.8.   Will increase Trulicity to 1.5 mg weekly.   Follow up in three months

## 2021-02-27 ENCOUNTER — Ambulatory Visit: Payer: 59 | Admitting: Adult Health

## 2021-02-27 ENCOUNTER — Encounter: Payer: Self-pay | Admitting: Adult Health

## 2021-02-27 VITALS — BP 140/80 | HR 81 | Temp 98.5°F | Ht 76.0 in | Wt 247.0 lb

## 2021-02-27 DIAGNOSIS — I10 Essential (primary) hypertension: Secondary | ICD-10-CM | POA: Diagnosis not present

## 2021-02-27 DIAGNOSIS — E119 Type 2 diabetes mellitus without complications: Secondary | ICD-10-CM | POA: Diagnosis not present

## 2021-02-27 LAB — POCT GLYCOSYLATED HEMOGLOBIN (HGB A1C): Hemoglobin A1C: 6.8 % — AB (ref 4.0–5.6)

## 2021-02-27 MED ORDER — METFORMIN HCL 1000 MG PO TABS: ORAL_TABLET | ORAL | 0 refills | Status: DC

## 2021-02-27 MED ORDER — BASAGLAR KWIKPEN 100 UNIT/ML ~~LOC~~ SOPN: 20.0000 [IU] | PEN_INJECTOR | Freq: Every day | SUBCUTANEOUS | 0 refills | Status: DC

## 2021-02-27 MED ORDER — GLIPIZIDE ER 10 MG PO TB24: 10.0000 mg | ORAL_TABLET | Freq: Every morning | ORAL | 0 refills | Status: DC

## 2021-02-27 MED ORDER — LISINOPRIL 20 MG PO TABS: 20.0000 mg | ORAL_TABLET | Freq: Every day | ORAL | 3 refills | Status: DC

## 2021-02-27 MED ORDER — TRULICITY 1.5 MG/0.5ML ~~LOC~~ SOAJ: 1.5000 mg | SUBCUTANEOUS | 0 refills | Status: AC

## 2021-02-27 MED ORDER — FREESTYLE LIBRE 3 SENSOR MISC: 1.0000 | 6 refills | Status: DC

## 2021-02-27 NOTE — Progress Notes (Signed)
Subjective:    Patient ID: Christopher Serrano, male    DOB: 1955-03-02, 66 y.o.   MRN: [id number]  HPI 66 year old male who  has a past medical history of Allergy, ASTHMA (07/25/2006), Depression, DIABETES MELLITUS, TYPE II (07/25/2006), HYPERLIPIDEMIA (01/07/2008), and HYPERTENSION (07/25/2006).  He presents to the office today for follow-up regarding diabetes    DM -is currently managed with metformin 1000 mg twice daily, glipizide 10 mg extended release daily, Basaglar 20 units daily, and Trulicity 1.5 mg weekly.  He reports that he has been checking his blood sugars in the morning and reports readings in the 200's. He does not check any other time.   He is going to the gym 3-4 times a week and is eating more vegan.   Highest his A1c has been was 15.6 on 08/26/2020  Lab Results  Component Value Date   HGBA1C 7.9 (H) 12/11/2020   HTN- was able to come off medication due to weight loss.blood pressure has now increased. Denies chest pain or shortness of breath.   Review of Systems See HPI   Past Medical History:  Diagnosis Date   Allergy    ASTHMA 07/25/2006   Depression    DIABETES MELLITUS, TYPE II 07/25/2006   HYPERLIPIDEMIA 01/07/2008   HYPERTENSION 07/25/2006    Social History   Socioeconomic History   Marital status: Married    Spouse name: Not on file   Number of children: Not on file   Years of education: Not on file   Highest education level: Not on file  Occupational History   Not on file  Tobacco Use   Smoking status: Never   Smokeless tobacco: Never  Substance and Sexual Activity   Alcohol use: Yes    Comment: every 6 -7 mths   Drug use: No   Sexual activity: Not on file  Other Topics Concern   Not on file  Social History Narrative   Patent attorney    Social Determinants of Health   Financial Resource Strain: Not on file  Food Insecurity: Not on file  Transportation Needs: Not on file  Physical Activity: Not on file  Stress: Not on file  Social  Connections: Not on file  Intimate Partner Violence: Not on file    Past Surgical History:  Procedure Laterality Date   SINUS IRRIGATION      Family History  Problem Relation Age of Onset   Hypertension Mother    Dementia Father    Diabetes Brother    Colon cancer Neg Hx    Esophageal cancer Neg Hx    Pancreatic cancer Neg Hx    Rectal cancer Neg Hx    Stomach cancer Neg Hx     Allergies  Allergen Reactions   Aleve [Naproxen] Shortness Of Breath   Aspirin    Ibuprofen     REACTION: sob/tightness    Current Outpatient Medications on File Prior to Visit  Medication Sig Dispense Refill   Dulaglutide (TRULICITY) 1.5 HQ/4.6NG SOPN Inject 1.5 mg into the skin once a week. 6 mL 0   fluticasone (FLONASE) 50 MCG/ACT nasal spray Place 2 sprays into both nostrils daily. 16 g 6   glipiZIDE (GLUCOTROL XL) 10 MG 24 hr tablet Take 1 tablet (10 mg total) by mouth every morning. 90 tablet 0   Insulin Glargine (BASAGLAR KWIKPEN) 100 UNIT/ML Inject 20 Units into the skin daily. 18 mL 0   metFORMIN (GLUCOPHAGE) 1000 MG tablet TAKE 1 TABLET(1000 MG) BY MOUTH  TWICE DAILY WITH A MEAL 180 tablet 0   simvastatin (ZOCOR) 40 MG tablet Take 1 tablet (40 mg total) by mouth daily at 6 PM. 90 tablet 3   No current facility-administered medications on file prior to visit.    BP 140/80    Pulse 81    Temp 98.5 F (36.9 C) (Oral)    Ht 6\' 4"  (1.93 m)    Wt 247 lb (112 kg)    SpO2 97%    BMI 30.07 kg/m       Objective:   Physical Exam Vitals and nursing note reviewed.  Constitutional:      Appearance: Normal appearance.  Cardiovascular:     Rate and Rhythm: Normal rate and regular rhythm.     Heart sounds: Normal heart sounds.  Pulmonary:     Effort: Pulmonary effort is normal.     Breath sounds: Normal breath sounds.  Musculoskeletal:        General: Normal range of motion.  Skin:    General: Skin is warm and dry.     Capillary Refill: Capillary refill takes less than 2 seconds.   Neurological:     General: No focal deficit present.     Mental Status: He is alert and oriented to person, place, and time.  Psychiatric:        Mood and Affect: Mood normal.        Behavior: Behavior normal.        Thought Content: Thought content normal.      Assessment & Plan:   1. Type 2 diabetes mellitus without complication, without long-term current use of insulin (HCC)  - POC HgB A1c- 6.8  - Has improved  - Will try and see if Elenor Legato is covered for him to better manage his blood sugars.  - Follow up in 3 months for CPE  - Continuous Blood Gluc Sensor (FREESTYLE LIBRE 3 SENSOR) MISC; 1 Device by Does not apply route every 14 (fourteen) days. Place 1 sensor on the skin every 14 days. Use to check glucose continuously  Dispense: 2 each; Refill: 6 - metFORMIN (GLUCOPHAGE) 1000 MG tablet; TAKE 1 TABLET(1000 MG) BY MOUTH TWICE DAILY WITH A MEAL  Dispense: 180 tablet; Refill: 0 - Insulin Glargine (BASAGLAR KWIKPEN) 100 UNIT/ML; Inject 20 Units into the skin daily.  Dispense: 18 mL; Refill: 0 - glipiZIDE (GLUCOTROL XL) 10 MG 24 hr tablet; Take 1 tablet (10 mg total) by mouth every morning.  Dispense: 90 tablet; Refill: 0 - Dulaglutide (TRULICITY) 1.5 ZO/1.0RU SOPN; Inject 1.5 mg into the skin once a week.  Dispense: 6 mL; Refill: 0  2. Essential hypertension - Will restart on lisinopril 20 mg  - lisinopril (ZESTRIL) 20 MG tablet; Take 1 tablet (20 mg total) by mouth daily.  Dispense: 90 tablet; Refill: 3   Dorothyann Peng, NP

## 2021-05-13 ENCOUNTER — Ambulatory Visit: Payer: 59 | Admitting: Adult Health

## 2021-05-13 ENCOUNTER — Encounter: Payer: Self-pay | Admitting: Adult Health

## 2021-05-13 VITALS — BP 132/78 | HR 88 | Temp 99.0°F | Ht 76.0 in | Wt 251.0 lb

## 2021-05-13 DIAGNOSIS — I10 Essential (primary) hypertension: Secondary | ICD-10-CM

## 2021-05-13 DIAGNOSIS — E119 Type 2 diabetes mellitus without complications: Secondary | ICD-10-CM | POA: Diagnosis not present

## 2021-05-13 DIAGNOSIS — N529 Male erectile dysfunction, unspecified: Secondary | ICD-10-CM

## 2021-05-13 LAB — POCT GLYCOSYLATED HEMOGLOBIN (HGB A1C): Hemoglobin A1C: 7 % — AB (ref 4.0–5.6)

## 2021-05-13 MED ORDER — TADALAFIL 20 MG PO TABS: 10.0000 mg | ORAL_TABLET | ORAL | 11 refills | Status: DC | PRN

## 2021-05-13 MED ORDER — TIRZEPATIDE 5 MG/0.5ML ~~LOC~~ SOAJ: 5.0000 mg | SUBCUTANEOUS | 0 refills | Status: AC

## 2021-05-13 NOTE — Progress Notes (Signed)
? ?Subjective:  ? ? Patient ID: Christopher Serrano, male    DOB: April 13, 1955, 66 y.o.   MRN: [id number] ? ?HPI ?66 year old male who  has a past medical history of Allergy, ASTHMA (07/25/2006), Depression, DIABETES MELLITUS, TYPE II (07/25/2006), HYPERLIPIDEMIA (01/07/2008), and HYPERTENSION (07/25/2006). ? ?He presents to the office today for follow-up regarding diabetes and hypertension. ? ?DM Type 2-currently managed with metformin 1000 mg twice daily, glipizide 10 mg ER daily, Basaglar 20 units daily and Trulicity 1.5 mg weekly.  He does report that he has been checking his blood sugars at home with readings in the 120s to 160s.  He has not had any episodes of hypoglycemia.  He continues to go to the gym 3-4 times a week and is eating more vegan.  Has been able to get his A1c down from a high of 15.6 in August 2022.  His last A1c in February 2023 was 6.8 ?Wt Readings from Last 3 Encounters:  ?05/13/21 251 lb (113.9 kg)  ?02/27/21 247 lb (112 kg)  ?12/11/20 243 lb (110.2 kg)  ? ?Hypertension-currently managed with lisinopril 20 mg daily.  He denies dizziness, lightheadedness, chest pain, or shortness of breath ?BP Readings from Last 3 Encounters:  ?05/13/21 132/78  ?02/27/21 140/80  ?12/11/20 100/72  ? ?Additionally, he has suffered from erectile dysfunction.  He has trouble maintaining an erection.  This is a chronic issue.  Has tried Viagra in the past but this caused a severe headache.  He is getting married on Saturday and would like something to help him momentarily. ? ? ?Review of Systems ?See HPI  ? ?Past Medical History:  ?Diagnosis Date  ? Allergy   ? ASTHMA 07/25/2006  ? Depression   ? DIABETES MELLITUS, TYPE II 07/25/2006  ? HYPERLIPIDEMIA 01/07/2008  ? HYPERTENSION 07/25/2006  ? ? ?Social History  ? ?Socioeconomic History  ? Marital status: Married  ?  Spouse name: Not on file  ? Number of children: Not on file  ? Years of education: Not on file  ? Highest education level: Not on file  ?Occupational History  ? Not  on file  ?Tobacco Use  ? Smoking status: Never  ? Smokeless tobacco: Never  ?Substance and Sexual Activity  ? Alcohol use: Yes  ?  Comment: every 6 -7 mths  ? Drug use: No  ? Sexual activity: Not on file  ?Other Topics Concern  ? Not on file  ?Social History Narrative  ? [employer]   ? ?Social Determinants of Health  ? ?Financial Resource Strain: Not on file  ?Food Insecurity: Not on file  ?Transportation Needs: Not on file  ?Physical Activity: Not on file  ?Stress: Not on file  ?Social Connections: Not on file  ?Intimate Partner Violence: Not on file  ? ? ?Past Surgical History:  ?Procedure Laterality Date  ? SINUS IRRIGATION    ? ? ?Family History  ?Problem Relation Age of Onset  ? Hypertension Mother   ? Dementia Father   ? Diabetes Brother   ? Colon cancer Neg Hx   ? Esophageal cancer Neg Hx   ? Pancreatic cancer Neg Hx   ? Rectal cancer Neg Hx   ? Stomach cancer Neg Hx   ? ? ?Allergies  ?Allergen Reactions  ? Aleve [Naproxen] Shortness Of Breath  ? Aspirin   ? Ibuprofen   ?  REACTION: sob/tightness  ? ? ?Current Outpatient Medications on File Prior to Visit  ?Medication Sig Dispense Refill  ?  Continuous Blood Gluc Sensor (FREESTYLE LIBRE 3 SENSOR) MISC 1 Device by Does not apply route every 14 (fourteen) days. Place 1 sensor on the skin every 14 days. Use to check glucose continuously 2 each 6  ? Dulaglutide (TRULICITY) 1.5 TD/3.2KG SOPN Inject 1.5 mg into the skin once a week. 6 mL 0  ? fluticasone (FLONASE) 50 MCG/ACT nasal spray Place 2 sprays into both nostrils daily. 16 g 6  ? glipiZIDE (GLUCOTROL XL) 10 MG 24 hr tablet Take 1 tablet (10 mg total) by mouth every morning. 90 tablet 0  ? Insulin Glargine (BASAGLAR KWIKPEN) 100 UNIT/ML Inject 20 Units into the skin daily. 18 mL 0  ? lisinopril (ZESTRIL) 20 MG tablet Take 1 tablet (20 mg total) by mouth daily. 90 tablet 3  ? metFORMIN (GLUCOPHAGE) 1000 MG tablet TAKE 1 TABLET(1000 MG) BY MOUTH TWICE DAILY WITH A MEAL 180 tablet 0  ? simvastatin (ZOCOR)  40 MG tablet Take 1 tablet (40 mg total) by mouth daily at 6 PM. 90 tablet 3  ? ?No current facility-administered medications on file prior to visit.  ? ? ?BP 132/78   Pulse 88   Temp 99 ?F (37.2 ?C) (Oral)   Ht '6\' 4"'$  (1.93 m)   Wt 251 lb (113.9 kg)   SpO2 98%   BMI 30.55 kg/m?  ? ? ?   ?Objective:  ? Physical Exam ?Vitals and nursing note reviewed.  ?Constitutional:   ?   Appearance: Normal appearance. He is obese.  ?Cardiovascular:  ?   Rate and Rhythm: Normal rate and regular rhythm.  ?   Pulses: Normal pulses.  ?   Heart sounds: Normal heart sounds.  ?Pulmonary:  ?   Effort: Pulmonary effort is normal.  ?   Breath sounds: Normal breath sounds.  ?Musculoskeletal:     ?   General: Normal range of motion.  ?Skin: ?   General: Skin is warm and dry.  ?Neurological:  ?   General: No focal deficit present.  ?   Mental Status: He is alert and oriented to person, place, and time.  ?Psychiatric:     ?   Mood and Affect: Mood normal.     ?   Behavior: Behavior normal.     ?   Thought Content: Thought content normal.     ?   Judgment: Judgment normal.  ? ?   ?Assessment & Plan:  ? ?1. Type 2 diabetes mellitus without complication, without long-term current use of insulin ([facility]) ? ?- POC HgB A1c- 7.0 -trial him on Mounjaro as it appears that this may be covered by his insurance.  We will start off at 5 mg.  He will let me know if this has been approved through his insurance.  If it is not approved then we will increase his Trulicity to 3 mg weekly. ?- tirzepatide (MOUNJARO) 5 MG/0.5ML Pen; Inject 5 mg into the skin once a week.  Dispense: 2 mL; Refill: 0 ? ?2. Essential hypertension ?- Continue with lisinopril 20 mg  ? ?3. Erectile dysfunction, unspecified erectile dysfunction type ? ?- tadalafil (CIALIS) 20 MG tablet; Take 0.5-1 tablets (10-20 mg total) by mouth every other day as needed for erectile dysfunction.  Dispense: 10 tablet; Refill: 11 ? ?Dorothyann Peng, NP ? ?

## 2021-05-14 ENCOUNTER — Ambulatory Visit: Payer: 59 | Admitting: Adult Health

## 2021-05-27 ENCOUNTER — Ambulatory Visit: Payer: 59 | Admitting: Adult Health

## 2021-06-07 ENCOUNTER — Other Ambulatory Visit: Payer: Self-pay | Admitting: Adult Health

## 2021-06-07 DIAGNOSIS — E119 Type 2 diabetes mellitus without complications: Secondary | ICD-10-CM

## 2021-07-14 ENCOUNTER — Other Ambulatory Visit: Payer: Self-pay | Admitting: Adult Health

## 2021-07-14 DIAGNOSIS — E119 Type 2 diabetes mellitus without complications: Secondary | ICD-10-CM

## 2021-07-14 MED ORDER — FREESTYLE LIBRE 3 SENSOR MISC: 1.0000 | 6 refills | Status: DC

## 2021-09-05 ENCOUNTER — Other Ambulatory Visit: Payer: Self-pay | Admitting: Adult Health

## 2021-09-05 DIAGNOSIS — E119 Type 2 diabetes mellitus without complications: Secondary | ICD-10-CM

## 2021-10-07 ENCOUNTER — Encounter: Payer: Self-pay | Admitting: Adult Health

## 2021-10-07 ENCOUNTER — Ambulatory Visit (INDEPENDENT_AMBULATORY_CARE_PROVIDER_SITE_OTHER): Payer: 59 | Admitting: Adult Health

## 2021-10-07 VITALS — BP 130/82 | HR 89 | Temp 98.8°F | Ht 75.5 in | Wt 242.0 lb

## 2021-10-07 DIAGNOSIS — E119 Type 2 diabetes mellitus without complications: Secondary | ICD-10-CM | POA: Diagnosis not present

## 2021-10-07 DIAGNOSIS — I10 Essential (primary) hypertension: Secondary | ICD-10-CM | POA: Diagnosis not present

## 2021-10-07 LAB — POCT GLYCOSYLATED HEMOGLOBIN (HGB A1C): Hemoglobin A1C: 7.6 % — AB (ref 4.0–5.6)

## 2021-10-07 MED ORDER — LISINOPRIL 20 MG PO TABS: 20.0000 mg | ORAL_TABLET | Freq: Every day | ORAL | 3 refills | Status: DC

## 2021-10-07 MED ORDER — GLIPIZIDE ER 10 MG PO TB24: ORAL_TABLET | ORAL | 0 refills | Status: DC

## 2021-10-07 MED ORDER — TIRZEPATIDE 2.5 MG/0.5ML ~~LOC~~ SOAJ: 2.5000 mg | SUBCUTANEOUS | 0 refills | Status: DC

## 2021-10-07 NOTE — Progress Notes (Signed)
Subjective:    Patient ID: Christopher Serrano, male    DOB: [birth year], 66 y.o.   MRN: [id number]  HPI 66 year old male who  has a past medical history of Allergy, ASTHMA (07/25/2006), Depression, DIABETES MELLITUS, TYPE II (07/25/2006), HYPERLIPIDEMIA (01/07/2008), and HYPERTENSION (07/25/2006).  He presents for follow up regarding DM and HTN   He was last seen 5 months ago   DM Type 2 - managed with metformin 1000 mg BID, glipizide 10 mg ER daily, basaglar 20 units daily. He was switched from Trulicity to [facility] during his last visit but never ended up picking it up from the pharmacy.  He reports that he is been going to the gym 3-4 times a week and is eating a vegan diet.  He has been out of his insulin for the last 2 weeks.  He does check his blood sugars with a continuous glucose monitor and reports readings around 150 without his insulin. He has been checking his blood sugars at home with readings below  Lab Results  Component Value Date   HGBA1C 7.0 (A) 05/13/2021   Wt Readings from Last 3 Encounters:  10/07/21 242 lb (109.8 kg)  05/13/21 251 lb (113.9 kg)  02/27/21 247 lb (112 kg)   HTN - managed with lisinopril 20 mg daily. He denies dizziness, lightheadedness, chest pain and shortness of breath BP Readings from Last 3 Encounters:  10/07/21 130/82  05/13/21 132/78  02/27/21 140/80   Review of Systems See HPI   Past Medical History:  Diagnosis Date   Allergy    ASTHMA 07/25/2006   Depression    DIABETES MELLITUS, TYPE II 07/25/2006   HYPERLIPIDEMIA 01/07/2008   HYPERTENSION 07/25/2006    Social History   Socioeconomic History   Marital status: Married    Spouse name: Not on file   Number of children: Not on file   Years of education: Not on file   Highest education level: Not on file  Occupational History   Not on file  Tobacco Use   Smoking status: Never   Smokeless tobacco: Never  Substance and Sexual Activity   Alcohol use: Yes    Comment: every 6 -7 mths    Drug use: No   Sexual activity: Not on file  Other Topics Concern   Not on file  Social History Narrative   Patent attorney    Social Determinants of Health   Financial Resource Strain: Not on file  Food Insecurity: Not on file  Transportation Needs: Not on file  Physical Activity: Not on file  Stress: Not on file  Social Connections: Not on file  Intimate Partner Violence: Not on file    Past Surgical History:  Procedure Laterality Date   SINUS IRRIGATION      Family History  Problem Relation Age of Onset   Hypertension Mother    Dementia Father    Diabetes Brother    Colon cancer Neg Hx    Esophageal cancer Neg Hx    Pancreatic cancer Neg Hx    Rectal cancer Neg Hx    Stomach cancer Neg Hx     Allergies  Allergen Reactions   Aleve [Naproxen] Shortness Of Breath   Aspirin    Ibuprofen     REACTION: sob/tightness    Current Outpatient Medications on File Prior to Visit  Medication Sig Dispense Refill   Continuous Blood Gluc Sensor (FREESTYLE LIBRE 3 SENSOR) MISC 1 Device by Does not apply route every 14 (fourteen)  days. Place 1 sensor on the skin every 14 days. Use to check glucose continuously 2 each 6   fluticasone (FLONASE) 50 MCG/ACT nasal spray Place 2 sprays into both nostrils daily. 16 g 6   metFORMIN (GLUCOPHAGE) 1000 MG tablet TAKE 1 TABLET(1000 MG) BY MOUTH TWICE DAILY WITH A MEAL 180 tablet 0   simvastatin (ZOCOR) 40 MG tablet Take 1 tablet (40 mg total) by mouth daily at 6 PM. 90 tablet 3   tadalafil (CIALIS) 20 MG tablet Take 0.5-1 tablets (10-20 mg total) by mouth every other day as needed for erectile dysfunction. 10 tablet 11   No current facility-administered medications on file prior to visit.    BP 130/82   Pulse 89   Temp 98.8 F (37.1 C) (Oral)   Ht 6' 3.5" (1.918 m)   Wt 242 lb (109.8 kg)   SpO2 96%   BMI 29.85 kg/m       Objective:   Physical Exam Vitals and nursing note reviewed.  Constitutional:      Appearance:  Normal appearance.  Cardiovascular:     Rate and Rhythm: Normal rate and regular rhythm.     Pulses: Normal pulses.     Heart sounds: Normal heart sounds.  Pulmonary:     Effort: Pulmonary effort is normal.     Breath sounds: Normal breath sounds.  Musculoskeletal:        General: Normal range of motion.  Skin:    General: Skin is warm and dry.  Neurological:     General: No focal deficit present.     Mental Status: He is alert and oriented to person, place, and time.  Psychiatric:        Mood and Affect: Mood normal.        Behavior: Behavior normal.        Thought Content: Thought content normal.        Assessment & Plan:  1. Type 2 diabetes mellitus without complication, without long-term current use of insulin (HCC)  - POC HgB A1c- 7.6 -has increased marginally.  We will send in Mounjaro 2.5 mg for him, he will pick it up from the pharmacy and start using.  He will follow-up in 1 month for reevaluation and to see if we can go up on his dose.  Since his blood sugars have been lowered due to diet and exercise we will have him come off the insulin at this time  - tirzepatide [facility]) 2.5 MG/0.5ML Pen; Inject 2.5 mg into the skin once a week.  Dispense: 2 mL; Refill: 0 - glipiZIDE (GLUCOTROL XL) 10 MG 24 hr tablet; TAKE 1 TABLET(10 MG) BY MOUTH EVERY MORNING  Dispense: 90 tablet; Refill: 0  2. Essential hypertension - Controlled.  - lisinopril (ZESTRIL) 20 MG tablet; Take 1 tablet (20 mg total) by mouth daily.  Dispense: 90 tablet; Refill: 3  Dorothyann Peng, NP

## 2021-10-08 ENCOUNTER — Ambulatory Visit: Payer: 59 | Admitting: Adult Health

## 2022-01-06 ENCOUNTER — Encounter: Payer: Self-pay | Admitting: Adult Health

## 2022-01-06 ENCOUNTER — Ambulatory Visit: Payer: 59 | Admitting: Adult Health

## 2022-01-06 VITALS — BP 138/80 | HR 88 | Temp 98.2°F | Ht 75.5 in | Wt 249.0 lb

## 2022-01-06 DIAGNOSIS — E119 Type 2 diabetes mellitus without complications: Secondary | ICD-10-CM

## 2022-01-06 DIAGNOSIS — I10 Essential (primary) hypertension: Secondary | ICD-10-CM | POA: Diagnosis not present

## 2022-01-06 LAB — MICROALBUMIN / CREATININE URINE RATIO
Creatinine,U: 144.4 mg/dL
Microalb Creat Ratio: 15.4 mg/g (ref 0.0–30.0)
Microalb, Ur: 22.2 mg/dL — ABNORMAL HIGH (ref 0.0–1.9)

## 2022-01-06 LAB — POCT GLYCOSYLATED HEMOGLOBIN (HGB A1C): Hemoglobin A1C: 6.7 % — AB (ref 4.0–5.6)

## 2022-01-06 MED ORDER — LISINOPRIL 20 MG PO TABS: 20.0000 mg | ORAL_TABLET | Freq: Every day | ORAL | 0 refills | Status: DC

## 2022-01-06 MED ORDER — METFORMIN HCL 1000 MG PO TABS: ORAL_TABLET | ORAL | 0 refills | Status: DC

## 2022-01-06 MED ORDER — GLIPIZIDE ER 10 MG PO TB24: ORAL_TABLET | ORAL | 0 refills | Status: DC

## 2022-01-06 MED ORDER — TIRZEPATIDE 5 MG/0.5ML ~~LOC~~ SOAJ: 5.0000 mg | SUBCUTANEOUS | 0 refills | Status: DC

## 2022-01-06 NOTE — Patient Instructions (Addendum)
Your A1c was 6.7 - this was an improvement   Lets follow up in one month for your physical exam   Please check your blood pressure at home

## 2022-01-06 NOTE — Progress Notes (Signed)
Subjective:    Patient ID: Christopher Serrano, male    DOB: 08/04/1955, 66 y.o.   MRN: [id number]  HPI 66 year old male who  has a past medical history of Allergy, ASTHMA (07/25/2006), Depression, DIABETES MELLITUS, TYPE II (07/25/2006), HYPERLIPIDEMIA (01/07/2008), and HYPERTENSION (07/25/2006).  DM Type 2 -managed with metformin 1000 mg twice daily, glipizide 10 mg ER daily, and Mounjaro 2.5 mg weekly.  During his last visit he reported that he was going to the gym 3-4 times a week and was eating a vegan diet.  He had been out of his insulin for roughly 2 weeks and his blood sugars have been stable with readings around 140.  Since we are starting [facility], decided to take him off his insulin. He continues to eat a vegan diet and go to the gym multiple times a week.  He reports tolerating Mounjaro well but has run out of the medication. Lab Results  Component Value Date   HGBA1C 6.7 (A) 01/06/2022   Wt Readings from Last 3 Encounters:  01/06/22 249 lb (112.9 kg)  10/07/21 242 lb (109.8 kg)  05/13/21 251 lb (113.9 kg)    HTN -managed with lisinopril 20 mg daily.  He denies dizziness, lightheadedness, chest pain, or shortness of breath. He does not monitor his BP at home .   BP Readings from Last 3 Encounters:  01/06/22 138/80  10/07/21 130/82  05/13/21 132/78    Review of Systems See HPI   Past Medical History:  Diagnosis Date   Allergy    ASTHMA 07/25/2006   Depression    DIABETES MELLITUS, TYPE II 07/25/2006   HYPERLIPIDEMIA 01/07/2008   HYPERTENSION 07/25/2006    Social History   Socioeconomic History   Marital status: Married    Spouse name: Not on file   Number of children: Not on file   Years of education: Not on file   Highest education level: Not on file  Occupational History   Not on file  Tobacco Use   Smoking status: Never   Smokeless tobacco: Never  Substance and Sexual Activity   Alcohol use: Yes    Comment: every 6 -7 mths   Drug use: No   Sexual activity:  Not on file  Other Topics Concern   Not on file  Social History Narrative   Patent attorney    Social Determinants of Health   Financial Resource Strain: Not on file  Food Insecurity: Not on file  Transportation Needs: Not on file  Physical Activity: Not on file  Stress: Not on file  Social Connections: Not on file  Intimate Partner Violence: Not on file    Past Surgical History:  Procedure Laterality Date   SINUS IRRIGATION      Family History  Problem Relation Age of Onset   Hypertension Mother    Dementia Father    Diabetes Brother    Colon cancer Neg Hx    Esophageal cancer Neg Hx    Pancreatic cancer Neg Hx    Rectal cancer Neg Hx    Stomach cancer Neg Hx     Allergies  Allergen Reactions   Aleve [Naproxen] Shortness Of Breath   Aspirin    Ibuprofen     REACTION: sob/tightness    Current Outpatient Medications on File Prior to Visit  Medication Sig Dispense Refill   Continuous Blood Gluc Sensor (FREESTYLE LIBRE 3 SENSOR) MISC 1 Device by Does not apply route every 14 (fourteen) days. Place 1 sensor on  the skin every 14 days. Use to check glucose continuously 2 each 6   fluticasone (FLONASE) 50 MCG/ACT nasal spray Place 2 sprays into both nostrils daily. 16 g 6   simvastatin (ZOCOR) 40 MG tablet Take 1 tablet (40 mg total) by mouth daily at 6 PM. 90 tablet 3   tadalafil (CIALIS) 20 MG tablet Take 0.5-1 tablets (10-20 mg total) by mouth every other day as needed for erectile dysfunction. 10 tablet 11   No current facility-administered medications on file prior to visit.    BP 138/80   Pulse 88   Temp 98.2 F (36.8 C) (Oral)   Ht 6' 3.5" (1.918 m)   Wt 249 lb (112.9 kg)   SpO2 98%   BMI 30.71 kg/m       Objective:   Physical Exam Vitals and nursing note reviewed.  Constitutional:      Appearance: Normal appearance. He is obese.  Cardiovascular:     Rate and Rhythm: Normal rate and regular rhythm.     Pulses: Normal pulses.     Heart sounds:  Normal heart sounds.  Pulmonary:     Effort: Pulmonary effort is normal.     Breath sounds: Normal breath sounds.  Musculoskeletal:        General: Normal range of motion.  Skin:    General: Skin is warm and dry.  Neurological:     General: No focal deficit present.     Mental Status: He is alert and oriented to person, place, and time.  Psychiatric:        Mood and Affect: Mood normal.        Behavior: Behavior normal.        Thought Content: Thought content normal.        Judgment: Judgment normal.       Assessment & Plan:  1. Type 2 diabetes mellitus without complication, without long-term current use of insulin (HCC)  - POC HgB A1c- 6.7  - Will increase his mounjaro to 5 mg weekly.  - Follow up in 30 days for CPE  - glipiZIDE (GLUCOTROL XL) 10 MG 24 hr tablet; TAKE 1 TABLET(10 MG) BY MOUTH EVERY MORNING  Dispense: 90 tablet; Refill: 0 - metFORMIN (GLUCOPHAGE) 1000 MG tablet; TAKE 1 TABLET(1000 MG) BY MOUTH TWICE DAILY WITH A MEAL  Dispense: 180 tablet; Refill: 0 - tirzepatide (MOUNJARO) 5 MG/0.5ML Pen; Inject 5 mg into the skin once a week.  Dispense: 2 mL; Refill: 0 - Microalbumin/Creatinine Ratio, Urine  2. Essential hypertension  - lisinopril (ZESTRIL) 20 MG tablet; Take 1 tablet (20 mg total) by mouth daily.  Dispense: 90 tablet; Refill: 0  Dorothyann Peng, NP

## 2022-02-09 ENCOUNTER — Telehealth: Payer: Self-pay

## 2022-02-09 NOTE — Telephone Encounter (Signed)
Pt stated that he is c/o stuffiness and stated that mucinex Dm is not working. Pt is wanting something called in to the pharmacy. Per Tommi Rumps pt needs a follow up visit. [place] office staff helping with scheduling pt.

## 2022-02-12 ENCOUNTER — Encounter: Payer: Self-pay | Admitting: Adult Health

## 2022-02-12 ENCOUNTER — Ambulatory Visit: Payer: 59 | Admitting: Adult Health

## 2022-02-12 VITALS — BP 140/110 | Temp 97.8°F | Ht 75.5 in | Wt 249.0 lb

## 2022-02-12 DIAGNOSIS — I10 Essential (primary) hypertension: Secondary | ICD-10-CM

## 2022-02-12 DIAGNOSIS — J0100 Acute maxillary sinusitis, unspecified: Secondary | ICD-10-CM | POA: Diagnosis not present

## 2022-02-12 MED ORDER — FLUTICASONE PROPIONATE 50 MCG/ACT NA SUSP: 2.0000 | Freq: Every day | NASAL | 6 refills | Status: DC

## 2022-02-12 MED ORDER — DOXYCYCLINE HYCLATE 100 MG PO CAPS: 100.0000 mg | ORAL_CAPSULE | Freq: Two times a day (BID) | ORAL | 0 refills | Status: DC

## 2022-02-12 NOTE — Progress Notes (Signed)
Subjective:    Patient ID: Christopher Serrano, male    DOB: 09-17-1955, 67 y.o.   MRN: [id number]  Sinus Problem This is a new problem. The current episode started in the past 7 days. The problem has been gradually worsening since onset. There has been no fever. Associated symptoms include congestion and sinus pressure. Pertinent negatives include no chills, coughing, diaphoresis, ear pain, headaches, shortness of breath, sneezing or swollen glands. Past treatments include oral decongestants. The treatment provided no relief.      Review of Systems  Constitutional:  Negative for chills and diaphoresis.  HENT:  Positive for congestion and sinus pressure. Negative for ear pain and sneezing.   Respiratory:  Negative for cough and shortness of breath.   Neurological:  Negative for headaches.   Past Medical History:  Diagnosis Date   Allergy    ASTHMA 07/25/2006   Depression    DIABETES MELLITUS, TYPE II 07/25/2006   HYPERLIPIDEMIA 01/07/2008   HYPERTENSION 07/25/2006    Social History   Socioeconomic History   Marital status: Married    Spouse name: Not on file   Number of children: Not on file   Years of education: Not on file   Highest education level: Not on file  Occupational History   Not on file  Tobacco Use   Smoking status: Never   Smokeless tobacco: Never  Substance and Sexual Activity   Alcohol use: Yes    Comment: every 6 -7 mths   Drug use: No   Sexual activity: Not on file  Other Topics Concern   Not on file  Social History Narrative   Patent attorney    Social Determinants of Health   Financial Resource Strain: Not on file  Food Insecurity: Not on file  Transportation Needs: Not on file  Physical Activity: Not on file  Stress: Not on file  Social Connections: Not on file  Intimate Partner Violence: Not on file    Past Surgical History:  Procedure Laterality Date   SINUS IRRIGATION      Family History  Problem Relation Age of Onset    Hypertension Mother    Dementia Father    Diabetes Brother    Colon cancer Neg Hx    Esophageal cancer Neg Hx    Pancreatic cancer Neg Hx    Rectal cancer Neg Hx    Stomach cancer Neg Hx     Allergies  Allergen Reactions   Aleve [Naproxen] Shortness Of Breath   Aspirin    Ibuprofen     REACTION: sob/tightness    Current Outpatient Medications on File Prior to Visit  Medication Sig Dispense Refill   Continuous Blood Gluc Sensor (FREESTYLE LIBRE 3 SENSOR) MISC 1 Device by Does not apply route every 14 (fourteen) days. Place 1 sensor on the skin every 14 days. Use to check glucose continuously 2 each 6   glipiZIDE (GLUCOTROL XL) 10 MG 24 hr tablet TAKE 1 TABLET(10 MG) BY MOUTH EVERY MORNING 90 tablet 0   lisinopril (ZESTRIL) 20 MG tablet Take 1 tablet (20 mg total) by mouth daily. 90 tablet 0   metFORMIN (GLUCOPHAGE) 1000 MG tablet TAKE 1 TABLET(1000 MG) BY MOUTH TWICE DAILY WITH A MEAL 180 tablet 0   simvastatin (ZOCOR) 40 MG tablet Take 1 tablet (40 mg total) by mouth daily at 6 PM. 90 tablet 3   tadalafil (CIALIS) 20 MG tablet Take 0.5-1 tablets (10-20 mg total) by mouth every other day as needed for erectile  dysfunction. 10 tablet 11   tirzepatide (MOUNJARO) 5 MG/0.5ML Pen Inject 5 mg into the skin once a week. 2 mL 0   No current facility-administered medications on file prior to visit.    BP (!) 140/110   Temp 97.8 F (36.6 C) (Oral)   Ht 6' 3.5" (1.918 m)   Wt 249 lb (112.9 kg)   BMI 30.71 kg/m       Objective:   Physical Exam Vitals and nursing note reviewed.  Constitutional:      Appearance: Normal appearance. He is obese.  HENT:     Nose: Congestion present. No rhinorrhea.     Right Turbinates: Enlarged and swollen.     Left Turbinates: Enlarged and swollen.     Right Sinus: Maxillary sinus tenderness present.     Left Sinus: Maxillary sinus tenderness present.     Mouth/Throat:     Mouth: Mucous membranes are moist.     Pharynx: Oropharynx is clear. No  oropharyngeal exudate.  Cardiovascular:     Rate and Rhythm: Normal rate and regular rhythm.     Pulses: Normal pulses.     Heart sounds: Normal heart sounds.  Pulmonary:     Effort: Pulmonary effort is normal.     Breath sounds: Normal breath sounds.  Neurological:     Mental Status: He is alert.        Assessment & Plan:   1. Acute non-recurrent maxillary sinusitis  - doxycycline (VIBRAMYCIN) 100 MG capsule; Take 1 capsule (100 mg total) by mouth 2 (two) times daily.  Dispense: 14 capsule; Refill: 0 - fluticasone (FLONASE) 50 MCG/ACT nasal spray; Place 2 sprays into both nostrils daily.  Dispense: 16 g; Refill: 6 - Follow up if not resolved by the end of abx therapy   2. Essential hypertension - BP elevated today - likely due to Muciex DM. Advised to stop this medication and refrain from decongestants in the future.   Dorothyann Peng, NP

## 2022-02-15 ENCOUNTER — Ambulatory Visit: Payer: 59 | Admitting: Podiatry

## 2022-02-15 ENCOUNTER — Encounter: Payer: Self-pay | Admitting: Podiatry

## 2022-02-15 VITALS — BP 143/106

## 2022-02-15 DIAGNOSIS — M2141 Flat foot [pes planus] (acquired), right foot: Secondary | ICD-10-CM | POA: Diagnosis not present

## 2022-02-15 DIAGNOSIS — M79674 Pain in right toe(s): Secondary | ICD-10-CM

## 2022-02-15 DIAGNOSIS — B353 Tinea pedis: Secondary | ICD-10-CM

## 2022-02-15 DIAGNOSIS — B351 Tinea unguium: Secondary | ICD-10-CM

## 2022-02-15 DIAGNOSIS — M79675 Pain in left toe(s): Secondary | ICD-10-CM | POA: Diagnosis not present

## 2022-02-15 DIAGNOSIS — E119 Type 2 diabetes mellitus without complications: Secondary | ICD-10-CM

## 2022-02-15 DIAGNOSIS — M2142 Flat foot [pes planus] (acquired), left foot: Secondary | ICD-10-CM

## 2022-02-15 MED ORDER — KETOCONAZOLE 2 % EX CREA: TOPICAL_CREAM | CUTANEOUS | 1 refills | Status: AC

## 2022-02-15 NOTE — Progress Notes (Unsigned)
Subjective: Christopher Serrano presents today {jgcomplaint:23593}.  Patient relates {Numbers; 0-100:15068} year h/o diabetes.  Patient denies any h/o foot wounds.  Patient has h/o foot ulcer of {jgPodToeLocator:23637}, which healed via help of ***.  Patient admits symptoms of foot numbness.   Patient admits symptoms of foot tingling.  Patient admits symptoms of burning in feet.  Patient admits symptoms of pins/needles sensation in feet.  Patient denies any numbness, tingling, burning, or pins/needle sensation in feet.  Patient has been diagnosed with neuropathy and it is managed with {JGNEUROPATHYMEDS:27053}.  Patient's blood sugar was *** mg/dl {Time; today/yesterday/ 2 days ago:19188}. Last known HgA1c was ***%.  Patient did not check blood glucose this morning.  Patient does not monitor blood glucose daily.  Risk factors: {jgriskfactors:24044}.  PCP is Nafziger, Tommi Rumps, NP , and last visit was {Time; dates multiple:304500300}.  Past Medical History:  Diagnosis Date   Allergy    ASTHMA 07/25/2006   Depression    DIABETES MELLITUS, TYPE II 07/25/2006   HYPERLIPIDEMIA 01/07/2008   HYPERTENSION 07/25/2006    Patient Active Problem List   Diagnosis Date Noted   History of colonic polyps 03/11/2015   Obesity 01/22/2014   Dyslipidemia 01/07/2008   Type 2 diabetes mellitus without complication ([place]) 47/42/5956   Essential hypertension 07/25/2006   Asthma 07/25/2006    Past Surgical History:  Procedure Laterality Date   SINUS IRRIGATION      Current Outpatient Medications on File Prior to Visit  Medication Sig Dispense Refill   Continuous Blood Gluc Sensor (FREESTYLE LIBRE 3 SENSOR) MISC 1 Device by Does not apply route every 14 (fourteen) days. Place 1 sensor on the skin every 14 days. Use to check glucose continuously 2 each 6   doxycycline (VIBRAMYCIN) 100 MG capsule Take 1 capsule (100 mg total) by mouth 2 (two) times daily. 14 capsule 0   fluticasone (FLONASE) 50  MCG/ACT nasal spray Place 2 sprays into both nostrils daily. 16 g 6   glipiZIDE (GLUCOTROL XL) 10 MG 24 hr tablet TAKE 1 TABLET(10 MG) BY MOUTH EVERY MORNING 90 tablet 0   lisinopril (ZESTRIL) 20 MG tablet Take 1 tablet (20 mg total) by mouth daily. 90 tablet 0   metFORMIN (GLUCOPHAGE) 1000 MG tablet TAKE 1 TABLET(1000 MG) BY MOUTH TWICE DAILY WITH A MEAL 180 tablet 0   simvastatin (ZOCOR) 40 MG tablet Take 1 tablet (40 mg total) by mouth daily at 6 PM. (Patient not taking: Reported on 02/15/2022) 90 tablet 3   tadalafil (CIALIS) 20 MG tablet Take 0.5-1 tablets (10-20 mg total) by mouth every other day as needed for erectile dysfunction. 10 tablet 11   tirzepatide (MOUNJARO) 5 MG/0.5ML Pen Inject 5 mg into the skin once a week. (Patient not taking: Reported on 02/15/2022) 2 mL 0   No current facility-administered medications on file prior to visit.     Allergies  Allergen Reactions   Aleve [Naproxen] Shortness Of Breath   Aspirin    Ibuprofen     REACTION: sob/tightness    Social History   Occupational History   Not on file  Tobacco Use   Smoking status: Never   Smokeless tobacco: Never  Substance and Sexual Activity   Alcohol use: Yes    Comment: every 6 -7 mths   Drug use: No   Sexual activity: Not on file    Family History  Problem Relation Age of Onset   Hypertension Mother    Dementia Father    Diabetes Brother  Colon cancer Neg Hx    Esophageal cancer Neg Hx    Pancreatic cancer Neg Hx    Rectal cancer Neg Hx    Stomach cancer Neg Hx     Immunization History  Administered Date(s) Administered   Influenza,inj,Quad PF,6+ Mos 10/10/2018, 11/23/2019   Pneumococcal Polysaccharide-23 12/24/2011   Tdap 12/24/2011    Objective: Vitals:   02/15/22 1603  BP: (!) 143/106    Christopher Serrano is a pleasant 67 y.o. male {jgbodyhabitus:24098} AAO X 3.  Vascular Examination: {jgvascular:23595}  Dermatological Examination: {jgderm:23598}  Neurological  Examination: {jgneuro:23601::"Protective sensation intact 5/5 intact bilaterally with 10g monofilament b/l.","Vibratory sensation intact b/l.","Proprioception intact bilaterally."}  Musculoskeletal Examination: {jgmsk:23600}  Footwear Assessment: Does the patient wear appropriate shoes? {Yes,No}. Does the patient need inserts/orthotics? {Yes,No}.  Assessment: No diagnosis found.   ADA Risk Categorization: Low Risk:  Patient has all of the following: Intact protective sensation No prior foot ulcer  No severe deformity Pedal pulses present  High Risk:  Patient has one or more of the following: Loss of protective sensation Absent pedal pulses Severe Foot deformity History of foot ulcer  Plan: {jgplan:23602::"-Patient/POA to call should there be question/concern in the interim."}  Return in about 3 months (around 05/17/2022).  Marzetta Board, DPM

## 2022-02-15 NOTE — Patient Instructions (Addendum)
To prevent reinfection, spray shoes with Lysol every evening.  Clean tub or shower with bleach based cleanser.  Athlete's Foot Athlete's foot (tinea pedis) is a fungal infection of the skin on your feet. It often occurs on the skin that is between or underneath the toes. It can also occur on the soles of your feet. The infection can spread from person to person (is contagious). It can also spread when a person's bare feet come in contact with the fungus on shower floors or on items such as shoes. What are the causes? This condition is caused by a fungus that grows in warm, moist places. You can get athlete's foot by sharing shoes, shower stalls, towels, and wet floors with someone who is infected. Not washing your feet or changing your socks often enough can also lead to athlete's foot. What increases the risk? This condition is more likely to develop in: Men. People who have a weak body defense system (immune system). People who have diabetes. People who use public showers, such as at a gym. People who wear heavy-duty shoes, such as Environmental manager. Seasons with warm, humid weather. What are the signs or symptoms? Symptoms of this condition include: Itchy areas between your toes or on the soles of your feet. White, flaky, or scaly areas between your toes or on the soles of your feet. Very itchy small blisters between your toes or on the soles of your feet. Small cuts in your skin. These cuts can become infected. Thick or discolored toenails. How is this diagnosed? This condition may be diagnosed with a physical exam and a review of your medical history. Your health care provider may also take a skin or toenail sample to examine under a microscope. How is this treated? This condition is treated with antifungal medicines. These may be applied as powders, ointments, or creams. In severe cases, an oral antifungal medicine may be given. Follow these instructions at  home: Medicines Apply or take over-the-counter and prescription medicines only as told by your health care provider. Apply your antifungal medicine as told by your health care provider. Do not stop using the antifungal even if your condition improves. Foot care Do not scratch your feet. Keep your feet dry: Wear cotton or wool socks. Change your socks every day or if they become wet. Wear shoes that allow air to flow, such as sandals or canvas tennis shoes. Wash and dry your feet, including the area between your toes. Also, wash and dry your feet: Every day or as told by your health care provider. After exercising. General instructions Do not let others use towels, shoes, nail clippers, or other personal items that touch your feet. Protect your feet by wearing sandals in wet areas, such as locker rooms and shared showers. Keep all follow-up visits. This is important. If you have diabetes, keep your blood sugar under control. Contact a health care provider if: You have a fever. You have swelling, soreness, warmth, or redness in your foot. Your feet are not getting better with treatment. Your symptoms get worse. You have new symptoms. You have severe pain. Summary Athlete's foot (tinea pedis) is a fungal infection of the skin on your feet. It often occurs on skin that is between or underneath the toes. This condition is caused by a fungus that grows in warm, moist places. Symptoms include white, flaky, or scaly areas between your toes or on the soles of your feet. This condition is treated with antifungal medicines. Keep  your feet clean. Always dry them thoroughly. This information is not intended to replace advice given to you by your health care provider. Make sure you discuss any questions you have with your health care provider. Document Revised: 05/04/2020 Document Reviewed: 05/04/2020 Elsevier Patient Education  [place]  WHAT IS IT? An  infection that lies within the keratin of your nail plate that is caused by a fungus.  WHY ME? Fungal infections affect all ages, sexes, races, and creeds.  There may be many factors that predispose you to a fungal infection such as age, coexisting medical conditions such as diabetes, or an autoimmune disease; stress, medications, fatigue, genetics, etc.  Bottom line: fungus thrives in a warm, moist environment and your shoes offer such a location.  IS IT CONTAGIOUS? Theoretically, yes.  You do not want to share shoes, nail clippers or files with someone who has fungal toenails.  Walking around barefoot in the same room or sleeping in the same bed is unlikely to transfer the organism.  It is important to realize, however, that fungus can spread easily from one nail to the next on the same foot.  HOW DO WE TREAT THIS?  There are several ways to treat this condition.  Treatment may depend on many factors such as age, medications, pregnancy, liver and kidney conditions, etc.  It is best to ask your doctor which options are available to you.  No treatment.   Unlike many other medical concerns, you can live with this condition.  However for many people this can be a painful condition and may lead to ingrown toenails or a bacterial infection.  It is recommended that you keep the nails cut short to help reduce the amount of fungal nail. Topical treatment.  These range from herbal remedies to prescription strength nail lacquers.  About 40-50% effective, topicals require twice daily application for approximately 9 to 12 months or until an entirely new nail has grown out.  The most effective topicals are medical grade medications available through physicians offices. Oral antifungal medications.  With an 80-90% cure rate, the most common oral medication requires 3 to 4 months of therapy and stays in your system for a year as the new nail grows out.  Oral antifungal medications do require blood work to make sure it  is a safe drug for you.  A liver function panel will be performed prior to starting the medication and after the first month of treatment.  It is important to have the blood work performed to avoid any harmful side effects.  In general, this medication safe but blood work is required. Laser Therapy.  This treatment is performed by applying a specialized laser to the affected nail plate.  This therapy is noninvasive, fast, and non-painful.  It is not covered by insurance and is therefore, out of pocket.  The results have been very good with a 80-95% cure rate.  The [facility] is the only practice in the area to offer this therapy. Permanent Nail Avulsion.  Removing the entire nail so that a new nail will not grow back.   Diabetes Mellitus and Foot Care Diabetes, also called diabetes mellitus, may cause problems with your feet and legs because of poor blood flow (circulation). Poor circulation may make your skin: Become thinner and drier. Break more easily. Heal more slowly. Peel and crack. You may also have nerve damage (neuropathy). This can cause decreased feeling in your legs and feet. This means  that you may not notice minor injuries to your feet that could lead to more serious problems. Finding and treating problems early is the best way to prevent future foot problems. How to care for your feet Foot hygiene  Wash your feet daily with warm water and mild soap. Do not use hot water. Then, pat your feet and the areas between your toes until they are fully dry. Do not soak your feet. This can dry your skin. Trim your toenails straight across. Do not dig under them or around the cuticle. File the edges of your nails with an emery board or nail file. Apply a moisturizing lotion or petroleum jelly to the skin on your feet and to dry, brittle toenails. Use lotion that does not contain alcohol and is unscented. Do not apply lotion between your toes. Shoes and socks Wear clean socks or stockings  every day. Make sure they are not too tight. Do not wear knee-high stockings. These may decrease blood flow to your legs. Wear shoes that fit well and have enough cushioning. Always look in your shoes before you put them on to be sure there are no objects inside. To break in new shoes, wear them for just a few hours a day. This prevents injuries on your feet. Wounds, scrapes, corns, and calluses  Check your feet daily for blisters, cuts, bruises, sores, and redness. If you cannot see the bottom of your feet, use a mirror or ask someone for help. Do not cut off corns or calluses or try to remove them with medicine. If you find a minor scrape, cut, or break in the skin on your feet, keep it and the skin around it clean and dry. You may clean these areas with mild soap and water. Do not clean the area with peroxide, alcohol, or iodine. If you have a wound, scrape, corn, or callus on your foot, look at it several times a day to make sure it is healing and not infected. Check for: Redness, swelling, or pain. Fluid or blood. Warmth. Pus or a bad smell. General tips Do not cross your legs. This may decrease blood flow to your feet. Do not use heating pads or hot water bottles on your feet. They may burn your skin. If you have lost feeling in your feet or legs, you may not know this is happening until it is too late. Protect your feet from hot and cold by wearing shoes, such as at the beach or on hot pavement. Schedule a complete foot exam at least once a year or more often if you have foot problems. Report any cuts, sores, or bruises to your health care provider right away. Where to find more information American Diabetes Association: diabetes.org Association of Diabetes Care & Education Specialists: diabeteseducator.org Contact a health care provider if: You have a condition that increases your risk of infection, and you have any cuts, sores, or bruises on your feet. You have an injury that is not  healing. You have redness on your legs or feet. You feel burning or tingling in your legs or feet. You have pain or cramps in your legs and feet. Your legs or feet are numb. Your feet always feel cold. You have pain around any toenails. Get help right away if: You have a wound, scrape, corn, or callus on your foot and: You have signs of infection. You have a fever. You have a red line going up your leg. This information is not intended to replace  advice given to you by your health care provider. Make sure you discuss any questions you have with your health care provider. Document Revised: 07/15/2021 Document Reviewed: 07/15/2021 Elsevier Patient Education  [place].

## 2022-03-06 ENCOUNTER — Other Ambulatory Visit: Payer: Self-pay | Admitting: Adult Health

## 2022-03-06 DIAGNOSIS — I10 Essential (primary) hypertension: Secondary | ICD-10-CM

## 2022-04-15 ENCOUNTER — Other Ambulatory Visit: Payer: Self-pay | Admitting: Adult Health

## 2022-04-15 DIAGNOSIS — E119 Type 2 diabetes mellitus without complications: Secondary | ICD-10-CM

## 2022-04-16 ENCOUNTER — Other Ambulatory Visit: Payer: Self-pay | Admitting: Adult Health

## 2022-04-16 DIAGNOSIS — E119 Type 2 diabetes mellitus without complications: Secondary | ICD-10-CM

## 2022-04-16 NOTE — Telephone Encounter (Signed)
Pt is due for CPE.  

## 2022-04-20 ENCOUNTER — Other Ambulatory Visit: Payer: Self-pay | Admitting: Adult Health

## 2022-04-22 ENCOUNTER — Other Ambulatory Visit: Payer: Self-pay | Admitting: Adult Health

## 2022-04-22 MED ORDER — TRELEGY ELLIPTA 100-62.5-25 MCG/ACT IN AEPB: 1.0000 | INHALATION_SPRAY | Freq: Every day | RESPIRATORY_TRACT | 3 refills | Status: AC

## 2022-05-24 ENCOUNTER — Ambulatory Visit: Payer: 59 | Admitting: Podiatry

## 2022-05-25 ENCOUNTER — Ambulatory Visit (INDEPENDENT_AMBULATORY_CARE_PROVIDER_SITE_OTHER): Payer: 59 | Admitting: Podiatry

## 2022-05-25 DIAGNOSIS — Z91199 Patient's noncompliance with other medical treatment and regimen due to unspecified reason: Secondary | ICD-10-CM

## 2022-05-26 NOTE — Progress Notes (Signed)
1. No-show for appointment     

## 2022-07-15 ENCOUNTER — Ambulatory Visit: Payer: 59 | Admitting: Adult Health

## 2022-07-15 ENCOUNTER — Encounter: Payer: Self-pay | Admitting: Adult Health

## 2022-07-15 VITALS — BP 160/110 | HR 95 | Temp 98.4°F | Ht 75.5 in | Wt 245.0 lb

## 2022-07-15 DIAGNOSIS — I10 Essential (primary) hypertension: Secondary | ICD-10-CM | POA: Diagnosis not present

## 2022-07-15 DIAGNOSIS — E785 Hyperlipidemia, unspecified: Secondary | ICD-10-CM

## 2022-07-15 DIAGNOSIS — E119 Type 2 diabetes mellitus without complications: Secondary | ICD-10-CM | POA: Diagnosis not present

## 2022-07-15 DIAGNOSIS — Z7985 Long-term (current) use of injectable non-insulin antidiabetic drugs: Secondary | ICD-10-CM | POA: Diagnosis not present

## 2022-07-15 LAB — POCT GLYCOSYLATED HEMOGLOBIN (HGB A1C): Hemoglobin A1C: 7 % — AB (ref 4.0–5.6)

## 2022-07-15 MED ORDER — SIMVASTATIN 40 MG PO TABS: 40.0000 mg | ORAL_TABLET | Freq: Every day | ORAL | 3 refills | Status: DC

## 2022-07-15 MED ORDER — GLIPIZIDE ER 10 MG PO TB24
ORAL_TABLET | ORAL | 0 refills | Status: DC
Start: 2022-07-15 — End: 2023-04-28

## 2022-07-15 MED ORDER — LISINOPRIL 20 MG PO TABS
ORAL_TABLET | ORAL | 0 refills | Status: DC
Start: 2022-07-15 — End: 2022-10-11

## 2022-07-15 MED ORDER — TIRZEPATIDE 5 MG/0.5ML ~~LOC~~ SOAJ: 5.0000 mg | SUBCUTANEOUS | 0 refills | Status: DC

## 2022-07-15 MED ORDER — METFORMIN HCL 1000 MG PO TABS
ORAL_TABLET | ORAL | 0 refills | Status: DC
Start: 2022-07-15 — End: 2022-10-12

## 2022-07-15 NOTE — Progress Notes (Signed)
Subjective:    Patient ID: Christopher Serrano, male    DOB: Mar 15, 1955, 67 y.o.   MRN: [id number]  HPI  67 year old male who  has a past medical history of Allergy, ASTHMA (07/25/2006), Depression, DIABETES MELLITUS, TYPE II (07/25/2006), HYPERLIPIDEMIA (01/07/2008), and HYPERTENSION (07/25/2006).  He presents to the office today for follow up regarding DM and HTN. He was last seen 7 months ago.   DM Type 2 -managed with metformin 1000 mg twice daily, glipizide 10 mg ER daily, and Mounjaro 5 mg weekly.  During his last visit he reported that he was going to the gym 3-4 times a week and was eating a vegan diet.   He continues to eat a vegan diet but has not been exercising routinely. He is fostering a 4 year girl who was diagnosed with cancer. He is traveling a lot between [place] she [place] getting her care.  Lab Results  Component Value Date   HGBA1C 6.7 (A) 01/06/2022   Wt Readings from Last 3 Encounters:  07/15/22 245 lb (111.1 kg)  02/12/22 249 lb (112.9 kg)  01/06/22 249 lb (112.9 kg)   HTN -managed with lisinopril 20 mg daily.  He denies dizziness, lightheadedness, chest pain, or shortness of breath. He does not monitor his BP at home. He is out of his medication   BP Readings from Last 3 Encounters:  07/15/22 (!) 160/110  02/15/22 (!) 143/106  02/12/22 (!) 140/110     Review of Systems See HPI   Past Medical History:  Diagnosis Date   Allergy    ASTHMA 07/25/2006   Depression    DIABETES MELLITUS, TYPE II 07/25/2006   HYPERLIPIDEMIA 01/07/2008   HYPERTENSION 07/25/2006    Social History   Socioeconomic History   Marital status: Married    Spouse name: Not on file   Number of children: Not on file   Years of education: Not on file   Highest education level: Not on file  Occupational History   Not on file  Tobacco Use   Smoking status: Never   Smokeless tobacco: Never  Substance and Sexual Activity   Alcohol use: Yes    Comment: every 6 -7 mths    Drug use: No   Sexual activity: Not on file  Other Topics Concern   Not on file  Social History Narrative   Production designer, theatre/television/film    Social Determinants of Health   Financial Resource Strain: Not on file  Food Insecurity: Not on file  Transportation Needs: Not on file  Physical Activity: Not on file  Stress: Not on file  Social Connections: Not on file  Intimate Partner Violence: Not on file    Past Surgical History:  Procedure Laterality Date   SINUS IRRIGATION      Family History  Problem Relation Age of Onset   Hypertension Mother    Dementia Father    Diabetes Brother    Colon cancer Neg Hx    Esophageal cancer Neg Hx    Pancreatic cancer Neg Hx    Rectal cancer Neg Hx    Stomach cancer Neg Hx     Allergies  Allergen Reactions   Aleve [Naproxen] Shortness Of Breath   Aspirin    Ibuprofen     REACTION: sob/tightness    Current Outpatient Medications on File Prior to Visit  Medication Sig Dispense Refill   Continuous Blood Gluc Sensor (FREESTYLE LIBRE 3 SENSOR) MISC 1 Device by Does not apply  route every 14 (fourteen) days. Place 1 sensor on the skin every 14 days. Use to check glucose continuously 2 each 6   fluticasone (FLONASE) 50 MCG/ACT nasal spray Place 2 sprays into both nostrils daily. 16 g 6   Fluticasone-Umeclidin-Vilant (TRELEGY ELLIPTA) 100-62.5-25 MCG/ACT AEPB Inhale 1 puff into the lungs daily. 60 each 3   ketoconazole (NIZORAL) 2 % cream Apply to both feet and between toes once daily for 6 weeks. 60 g 1   tadalafil (CIALIS) 20 MG tablet Take 0.5-1 tablets (10-20 mg total) by mouth every other day as needed for erectile dysfunction. 10 tablet 11   No current facility-administered medications on file prior to visit.    BP (!) 160/110   Pulse 95   Temp 98.4 F (36.9 C) (Oral)   Ht 6' 3.5" (1.918 m)   Wt 245 lb (111.1 kg)   SpO2 98%   BMI 30.22 kg/m       Objective:   Physical Exam Vitals and nursing note reviewed.  Constitutional:       Appearance: Normal appearance. He is obese.  Cardiovascular:     Rate and Rhythm: Normal rate and regular rhythm.     Pulses: Normal pulses.     Heart sounds: Normal heart sounds.  Pulmonary:     Effort: Pulmonary effort is normal.     Breath sounds: Normal breath sounds.  Skin:    General: Skin is warm and dry.  Neurological:     General: No focal deficit present.     Mental Status: He is alert and oriented to person, place, and time.  Psychiatric:        Mood and Affect: Mood normal.        Behavior: Behavior normal.        Thought Content: Thought content normal.        Judgment: Judgment normal.        Assessment & Plan:  1. Type 2 diabetes mellitus without complication, without long-term current use of insulin (HCC)  - POC HgB A1c- 7.0  - Will restart him on Mounjaro.  - He has an appointment next month for his CPE  - Microalbumin/Creatinine Ratio, Urine - glipiZIDE (GLUCOTROL XL) 10 MG 24 hr tablet; TAKE 1 TABLET(10 MG) BY MOUTH EVERY MORNING  Dispense: 90 tablet; Refill: 0 - metFORMIN (GLUCOPHAGE) 1000 MG tablet; TAKE 1 TABLET(1000 MG) BY MOUTH TWICE DAILY WITH A MEAL  Dispense: 180 tablet; Refill: 0 - tirzepatide (MOUNJARO) 5 MG/0.5ML Pen; Inject 5 mg into the skin once a week.  Dispense: 2 mL; Refill: 0  2. Essential hypertension - Encouraged to monitor at home  - lisinopril (ZESTRIL) 20 MG tablet; TAKE 1 TABLET(20 MG) BY MOUTH DAILY  Dispense: 90 tablet; Refill: 0  3. Dyslipidemia  - simvastatin (ZOCOR) 40 MG tablet; Take 1 tablet (40 mg total) by mouth daily at 6 PM.  Dispense: 90 tablet; Refill: 3  Shirline Frees, NP

## 2022-07-15 NOTE — Patient Instructions (Signed)
Your A1c was 7.0.   I have sent in all medications I want you to take.   I will see you back in one month

## 2022-08-05 ENCOUNTER — Encounter: Payer: Self-pay | Admitting: Adult Health

## 2022-08-05 ENCOUNTER — Ambulatory Visit (INDEPENDENT_AMBULATORY_CARE_PROVIDER_SITE_OTHER): Payer: 59 | Admitting: Adult Health

## 2022-08-05 VITALS — BP 132/84 | HR 84 | Temp 98.7°F | Ht 72.5 in | Wt 245.0 lb

## 2022-08-05 DIAGNOSIS — Z125 Encounter for screening for malignant neoplasm of prostate: Secondary | ICD-10-CM

## 2022-08-05 DIAGNOSIS — N529 Male erectile dysfunction, unspecified: Secondary | ICD-10-CM | POA: Diagnosis not present

## 2022-08-05 DIAGNOSIS — E785 Hyperlipidemia, unspecified: Secondary | ICD-10-CM | POA: Diagnosis not present

## 2022-08-05 DIAGNOSIS — Z0000 Encounter for general adult medical examination without abnormal findings: Secondary | ICD-10-CM | POA: Diagnosis not present

## 2022-08-05 DIAGNOSIS — E119 Type 2 diabetes mellitus without complications: Secondary | ICD-10-CM

## 2022-08-05 DIAGNOSIS — I10 Essential (primary) hypertension: Secondary | ICD-10-CM

## 2022-08-05 DIAGNOSIS — R1905 Periumbilic swelling, mass or lump: Secondary | ICD-10-CM

## 2022-08-05 DIAGNOSIS — Z7985 Long-term (current) use of injectable non-insulin antidiabetic drugs: Secondary | ICD-10-CM

## 2022-08-05 LAB — MICROALBUMIN / CREATININE URINE RATIO
Creatinine,U: 193 mg/dL
Microalb Creat Ratio: 8.1 mg/g (ref 0.0–30.0)
Microalb, Ur: 15.6 mg/dL — ABNORMAL HIGH (ref 0.0–1.9)

## 2022-08-05 LAB — CBC
HCT: 43.8 % (ref 39.0–52.0)
Hemoglobin: 14 g/dL (ref 13.0–17.0)
MCHC: 32 g/dL (ref 30.0–36.0)
MCV: 83 fl (ref 78.0–100.0)
Platelets: 244 10*3/uL (ref 150.0–400.0)
RBC: 5.28 Mil/uL (ref 4.22–5.81)
RDW: 14.9 % (ref 11.5–15.5)
WBC: 9 10*3/uL (ref 4.0–10.5)

## 2022-08-05 LAB — COMPREHENSIVE METABOLIC PANEL
ALT: 12 U/L (ref 0–53)
AST: 13 U/L (ref 0–37)
Albumin: 4.3 g/dL (ref 3.5–5.2)
Alkaline Phosphatase: 59 U/L (ref 39–117)
BUN: 12 mg/dL (ref 6–23)
CO2: 27 mEq/L (ref 19–32)
Calcium: 9.2 mg/dL (ref 8.4–10.5)
Chloride: 103 mEq/L (ref 96–112)
Creatinine, Ser: 0.9 mg/dL (ref 0.40–1.50)
GFR: 89.26 mL/min (ref >60.00)
Glucose, Bld: 106 mg/dL — ABNORMAL HIGH (ref 70–99)
Potassium: 4.1 mEq/L (ref 3.5–5.1)
Sodium: 139 mEq/L (ref 135–145)
Total Bilirubin: 1 mg/dL (ref 0.2–1.2)
Total Protein: 7.4 g/dL (ref 6.0–8.3)

## 2022-08-05 LAB — LIPID PANEL
Cholesterol: 135 mg/dL (ref 0–200)
HDL: 45.4 mg/dL (ref >39.00)
LDL Cholesterol: 75 mg/dL (ref 0–99)
NonHDL: 89.64
Total CHOL/HDL Ratio: 3
Triglycerides: 74 mg/dL (ref 0.0–149.0)
VLDL: 14.8 mg/dL (ref 0.0–40.0)

## 2022-08-05 LAB — TSH: TSH: 0.9 u[IU]/mL (ref 0.35–5.50)

## 2022-08-05 LAB — PSA: PSA: 1.42 ng/mL (ref 0.10–4.00)

## 2022-08-05 MED ORDER — TIRZEPATIDE 7.5 MG/0.5ML ~~LOC~~ SOAJ
7.5000 mg | SUBCUTANEOUS | 0 refills | Status: DC
Start: 2022-08-05 — End: 2023-01-14

## 2022-08-05 NOTE — Progress Notes (Signed)
Subjective:    Patient ID: Christopher Serrano, male    DOB: [birth year], 67 y.o.   MRN: [id number]  HPI Patient presents for yearly preventative medicine examination. He is a pleasant 67 year old male who  has a past medical history of Allergy, ASTHMA (07/25/2006), Depression, DIABETES MELLITUS, TYPE II (07/25/2006), HYPERLIPIDEMIA (01/07/2008), and HYPERTENSION (07/25/2006).  DM Type 2 -managed with metformin 1000 mg twice daily, glipizide 10 mg ER daily, and Mounjaro 5 mg weekly.    He continues to eat a vegan diet but has not been exercising routinely. He was fostering a 4 year girl who was diagnosed with cancer and traveling a lot between [place] and [place] where she is getting her care but reports recently that social services took the girl away from his wife and him d/t social services feeling as though they could not care medically for he.  Lab Results  Component Value Date   HGBA1C 7.0 (A) 07/15/2022   Wt Readings from Last 3 Encounters:  08/05/22 245 lb (111.1 kg)  07/15/22 245 lb (111.1 kg)  02/12/22 249 lb (112.9 kg)   HTN -managed with lisinopril 20 mg daily.  He denies dizziness, lightheadedness, chest pain, or shortness of breath. He does not monitor his BP at home. He is out of his medication   BP Readings from Last 3 Encounters:  08/05/22 (!) 150/110  07/15/22 (!) 160/110  02/15/22 (!) 143/106   Hyperlipidemia - manages with simvastatin 40 mg nightly.  He denies myalgia or fatigue. Lab Results  Component Value Date   CHOL 131 11/23/2019   HDL 41 11/23/2019   LDLCALC 72 11/23/2019   LDLDIRECT 150.4 03/09/2010   TRIG 99 11/23/2019   CHOLHDL 3.2 11/23/2019   ED - uses Viagra 20 mg PRN  Abdominal Pain -reports that earlier this week he felt, a knot protruding from my stomach".  Does not was painful.  He went to bed and when he woke up the next morning the knot was gone.  He denies pain but continues to be "a little sore" when he pushes on this area.  All immunizations  and health maintenance protocols were reviewed with the patient and needed orders were placed. Refused vaccinations today since he will be traveling to [facility] this weeknd.   Appropriate screening laboratory values were ordered for the patient including screening of hyperlipidemia, renal function and hepatic function. If indicated by BPH, a PSA was ordered.  Medication reconciliation,  past medical history, social history, problem list and allergies were reviewed in detail with the patient  Goals were established with regard to weight loss, exercise, and  diet in compliance with medications  He is up to date on routine colon cancer screening   Review of Systems  Constitutional: Negative.   HENT: Negative.    Eyes: Negative.   Respiratory: Negative.    Cardiovascular: Negative.   Gastrointestinal:  Positive for abdominal pain.  Endocrine: Negative.   Genitourinary: Negative.   Musculoskeletal: Negative.   Skin: Negative.   Allergic/Immunologic: Negative.   Neurological: Negative.   Hematological: Negative.   Psychiatric/Behavioral: Negative.    All other systems reviewed and are negative.  Past Medical History:  Diagnosis Date   Allergy    ASTHMA 07/25/2006   Depression    DIABETES MELLITUS, TYPE II 07/25/2006   HYPERLIPIDEMIA 01/07/2008   HYPERTENSION 07/25/2006    Social History   Socioeconomic History   Marital status: Married    Spouse name: Not on file   Number  of children: Not on file   Years of education: Not on file   Highest education level: Not on file  Occupational History   Not on file  Tobacco Use   Smoking status: Never   Smokeless tobacco: Never  Substance and Sexual Activity   Alcohol use: Yes    Comment: every 6 -7 mths   Drug use: No   Sexual activity: Not on file  Other Topics Concern   Not on file  Social History Narrative   Production designer, theatre/television/film    Social Determinants of Health   Financial Resource Strain: Not on file  Food Insecurity: Not  on file  Transportation Needs: Not on file  Physical Activity: Not on file  Stress: Not on file  Social Connections: Not on file  Intimate Partner Violence: Not on file    Past Surgical History:  Procedure Laterality Date   SINUS IRRIGATION      Family History  Problem Relation Age of Onset   Hypertension Mother    Dementia Father    Diabetes Brother    Colon cancer Neg Hx    Esophageal cancer Neg Hx    Pancreatic cancer Neg Hx    Rectal cancer Neg Hx    Stomach cancer Neg Hx     Allergies  Allergen Reactions   Aleve [Naproxen] Shortness Of Breath   Aspirin    Ibuprofen     REACTION: sob/tightness    Current Outpatient Medications on File Prior to Visit  Medication Sig Dispense Refill   Continuous Blood Gluc Sensor (FREESTYLE LIBRE 3 SENSOR) MISC 1 Device by Does not apply route every 14 (fourteen) days. Place 1 sensor on the skin every 14 days. Use to check glucose continuously 2 each 6   fluticasone (FLONASE) 50 MCG/ACT nasal spray Place 2 sprays into both nostrils daily. 16 g 6   Fluticasone-Umeclidin-Vilant (TRELEGY ELLIPTA) 100-62.5-25 MCG/ACT AEPB Inhale 1 puff into the lungs daily. 60 each 3   glipiZIDE (GLUCOTROL XL) 10 MG 24 hr tablet TAKE 1 TABLET(10 MG) BY MOUTH EVERY MORNING 90 tablet 0   ketoconazole (NIZORAL) 2 % cream Apply to both feet and between toes once daily for 6 weeks. 60 g 1   lisinopril (ZESTRIL) 20 MG tablet TAKE 1 TABLET(20 MG) BY MOUTH DAILY 90 tablet 0   metFORMIN (GLUCOPHAGE) 1000 MG tablet TAKE 1 TABLET(1000 MG) BY MOUTH TWICE DAILY WITH A MEAL 180 tablet 0   simvastatin (ZOCOR) 40 MG tablet Take 1 tablet (40 mg total) by mouth daily at 6 PM. 90 tablet 3   tadalafil (CIALIS) 20 MG tablet Take 0.5-1 tablets (10-20 mg total) by mouth every other day as needed for erectile dysfunction. 10 tablet 11   tirzepatide (MOUNJARO) 5 MG/0.5ML Pen Inject 5 mg into the skin once a week. 2 mL 0   No current facility-administered medications on file prior  to visit.    BP (!) 150/110   Pulse 84   Temp 98.7 F (37.1 C) (Oral)   Ht 6' 0.5" (1.842 m)   Wt 245 lb (111.1 kg)   SpO2 98%   BMI 32.77 kg/m       Objective:   Physical Exam Vitals and nursing note reviewed.  Constitutional:      General: He is not in acute distress.    Appearance: Normal appearance. He is obese. He is not ill-appearing.  HENT:     Head: Normocephalic and atraumatic.     Right Ear: Tympanic membrane, ear  canal and external ear normal. There is no impacted cerumen.     Left Ear: Tympanic membrane, ear canal and external ear normal. There is no impacted cerumen.     Nose: Nose normal. No congestion or rhinorrhea.     Mouth/Throat:     Mouth: Mucous membranes are moist.     Pharynx: Oropharynx is clear.  Eyes:     Extraocular Movements: Extraocular movements intact.     Conjunctiva/sclera: Conjunctivae normal.     Pupils: Pupils are equal, round, and reactive to light.  Neck:     Vascular: No carotid bruit.  Cardiovascular:     Rate and Rhythm: Normal rate and regular rhythm.     Pulses: Normal pulses.     Heart sounds: No murmur heard.    No friction rub. No gallop.  Pulmonary:     Effort: Pulmonary effort is normal.     Breath sounds: Normal breath sounds.  Abdominal:     General: Abdomen is flat. Bowel sounds are normal. There is no distension.     Palpations: Abdomen is soft. There is no mass.     Tenderness: There is abdominal tenderness in the periumbilical area. There is no guarding or rebound.     Hernia: No hernia is present.       Comments: Mass versus hernia noted above umbilicus.  He is slightly tender with deep palpation.  Musculoskeletal:        General: Normal range of motion.     Cervical back: Normal range of motion and neck supple.  Lymphadenopathy:     Cervical: No cervical adenopathy.  Skin:    General: Skin is warm and dry.     Capillary Refill: Capillary refill takes less than 2 seconds.  Neurological:     General: No  focal deficit present.     Mental Status: He is alert and oriented to person, place, and time.  Psychiatric:        Mood and Affect: Mood normal.        Behavior: Behavior normal.        Thought Content: Thought content normal.        Judgment: Judgment normal.       Assessment & Plan:   1. Routine general medical examination at a health care facility Today patient counseled on age appropriate routine health concerns for screening and prevention, each reviewed and up to date or declined. Immunizations reviewed and up to date or declined. Labs ordered and reviewed. Risk factors for depression reviewed and negative. Hearing function and visual acuity are intact. ADLs screened and addressed as needed. Functional ability and level of safety reviewed and appropriate. Education, counseling and referrals performed based on assessed risks today. Patient provided with a copy of personalized plan for preventive services. - Follow up  in one year or sooner if needed - Continue to eat healthy. Get back into the gym  2. Type 2 diabetes mellitus without complication, without long-term current use of insulin (HCC) - Increase mounjaro to 7.5 weekly - Follow up in one month  - Lipid panel; Future - TSH; Future - CBC; Future - Comprehensive metabolic panel; Future - Microalbumin/Creatinine Ratio, Urine; Future - tirzepatide (MOUNJARO) 7.5 MG/0.5ML Pen; Inject 7.5 mg into the skin once a week.  Dispense: 6 mL; Refill: 0  3. Essential hypertension - controlled. No change  - Lipid panel; Future - TSH; Future - CBC; Future - Comprehensive metabolic panel; Future - Microalbumin/Creatinine Ratio, Urine; Future  4.  Dyslipidemia - Consider increase in statin  - Lipid panel; Future - TSH; Future - CBC; Future - Comprehensive metabolic panel; Future  5. Erectile dysfunction, unspecified erectile dysfunction type  - Lipid panel; Future - TSH; Future - CBC; Future - Comprehensive metabolic panel;  Future  6. Prostate cancer screening  - PSA; Future  7. Periumbilical mass  - CT ABDOMEN PELVIS W CONTRAST; Future  Shirline Frees, NP

## 2022-08-27 ENCOUNTER — Encounter: Payer: Self-pay | Admitting: Adult Health

## 2022-09-03 ENCOUNTER — Ambulatory Visit
Admission: RE | Admit: 2022-09-03 | Discharge: 2022-09-03 | Disposition: A | Discharge status: Home / Self Care | Payer: 59 | Source: Ambulatory Visit | Attending: Adult Health | Admitting: Adult Health

## 2022-09-03 DIAGNOSIS — R1905 Periumbilic swelling, mass or lump: Secondary | ICD-10-CM

## 2022-09-03 MED ORDER — IOPAMIDOL (ISOVUE-300) INJECTION 61%
200.0000 mL | Freq: Once | INTRAVENOUS | Status: AC | PRN
  Administered 2022-09-03: 80 mL via INTRAVENOUS

## 2022-09-07 ENCOUNTER — Other Ambulatory Visit: Payer: Self-pay | Admitting: *Deleted

## 2022-09-07 DIAGNOSIS — R1905 Periumbilic swelling, mass or lump: Secondary | ICD-10-CM

## 2022-09-07 DIAGNOSIS — R9389 Abnormal findings on diagnostic imaging of other specified body structures: Secondary | ICD-10-CM

## 2022-09-13 ENCOUNTER — Telehealth: Payer: Self-pay | Admitting: Adult Health

## 2022-09-13 NOTE — Telephone Encounter (Signed)
Patient is calling to get an update on the status of his referral.  He states he received a call at work about his CT but it was busy so he couldn't get all the details of what the person was saying on the other end of the line.  He is requesting a call back.

## 2022-09-14 NOTE — Telephone Encounter (Signed)
Pt aware to call [facility] to get more information regarding his appt. Via vm.

## 2022-10-09 ENCOUNTER — Other Ambulatory Visit: Payer: Self-pay | Admitting: Adult Health

## 2022-10-09 DIAGNOSIS — I10 Essential (primary) hypertension: Secondary | ICD-10-CM

## 2022-10-12 ENCOUNTER — Other Ambulatory Visit: Payer: Self-pay | Admitting: Adult Health

## 2022-10-12 DIAGNOSIS — E119 Type 2 diabetes mellitus without complications: Secondary | ICD-10-CM

## 2022-11-26 NOTE — Pre-Procedure Instructions (Signed)
Surgical Instructions   Your procedure is scheduled on December 03, 2022. Report to [facility] Main Entrance "A" at 11:00 A.M., then check in with the Admitting office. Any questions or running late day of surgery: call [phone]  Questions prior to your surgery date: call [phone], Monday-Friday, 8am-4pm. If you experience any cold or flu symptoms such as cough, fever, chills, shortness of breath, etc. between now and your scheduled surgery, please notify us at the above number.     Remember:  Do not eat after midnight the night before your surgery   You may drink clear liquids until 10:00 AM the morning of your surgery.   Clear liquids allowed are: Water, Non-Citrus Juices (without pulp), Carbonated Beverages, Clear Tea, Black Coffee Only (NO MILK, CREAM OR POWDERED CREAMER of any kind), and Gatorade.    Take these medicines the morning of surgery with A SIP OF WATER: fluticasone (FLONASE) nasal spray - if needed   One week prior to surgery, STOP taking any Aspirin (unless otherwise instructed by your surgeon) Aleve, Naproxen, Ibuprofen, Motrin, Advil, Goody's, BC's, all herbal medications, fish oil, and non-prescription vitamins.   WHAT DO I DO ABOUT MY DIABETES MEDICATION?   Do not take glipiZIDE (GLUCOTROL XL) the evening before surgery or the morning of surgery.  Do not take metFORMIN (GLUCOPHAGE) the morning of surgery.     STOP taking your tirzepatide [facility]) one week prior to surgery. Do not take any doses after October 31st.   HOW TO MANAGE YOUR DIABETES BEFORE AND AFTER SURGERY  Why is it important to control my blood sugar before and after surgery? Improving blood sugar levels before and after surgery helps healing and can limit problems. A way of improving blood sugar control is eating a healthy diet by:  Eating less sugar and carbohydrates  Increasing activity/exercise  Talking with your doctor about reaching your blood sugar goals High blood sugars  (greater than 180 mg/dL) can raise your risk of infections and slow your recovery, so you will need to focus on controlling your diabetes during the weeks before surgery. Make sure that the doctor who takes care of your diabetes knows about your planned surgery including the date and location.  How do I manage my blood sugar before surgery? Check your blood sugar at least 4 times a day, starting 2 days before surgery, to make sure that the level is not too high or low.  Check your blood sugar the morning of your surgery when you wake up and every 2 hours until you get to the Short Stay unit.  If your blood sugar is less than 70 mg/dL, you will need to treat for low blood sugar: Do not take insulin. Treat a low blood sugar (less than 70 mg/dL) with  cup of clear juice (cranberry or apple), 4 glucose tablets, OR glucose gel. Recheck blood sugar in 15 minutes after treatment (to make sure it is greater than 70 mg/dL). If your blood sugar is not greater than 70 mg/dL on recheck, call [phone] for further instructions. Report your blood sugar to the short stay nurse when you get to Short Stay.  If you are admitted to the hospital after surgery: Your blood sugar will be checked by the staff and you will probably be given insulin after surgery (instead of oral diabetes medicines) to make sure you have good blood sugar levels. The goal for blood sugar control after surgery is 80-180 mg/dL.  Do NOT Smoke (Tobacco/Vaping) for 24 hours prior to your procedure.  If you use a CPAP at night, you may bring your mask/headgear for your overnight stay.   You will be asked to remove any contacts, glasses, piercing's, hearing aid's, dentures/partials prior to surgery. Please bring cases for these items if needed.    Patients discharged the day of surgery will not be allowed to drive home, and someone needs to stay with them for 24 hours.  SURGICAL WAITING ROOM VISITATION Patients  may have no more than 2 support people in the waiting area - these visitors may rotate.   Pre-op nurse will coordinate an appropriate time for 1 ADULT support person, who may not rotate, to accompany patient in pre-op.  Children under the age of 66 must have an adult with them who is not the patient and must remain in the main waiting area with an adult.  If the patient needs to stay at the hospital during part of their recovery, the visitor guidelines for inpatient rooms apply.  Please refer to the [facility] website for the visitor guidelines for any additional information.   If you received a COVID test during your pre-op visit  it is requested that you wear a mask when out in public, stay away from anyone that may not be feeling well and notify your surgeon if you develop symptoms. If you have been in contact with anyone that has tested positive in the last 10 days please notify you surgeon.      Pre-operative CHG Bathing Instructions   You can play a key role in reducing the risk of infection after surgery. Your skin needs to be as free of germs as possible. You can reduce the number of germs on your skin by washing with CHG (chlorhexidine gluconate) soap before surgery. CHG is an antiseptic soap that kills germs and continues to kill germs even after washing.   DO NOT use if you have an allergy to chlorhexidine/CHG or antibacterial soaps. If your skin becomes reddened or irritated, stop using the CHG and notify one of our RNs at [phone].              TAKE A SHOWER THE NIGHT BEFORE SURGERY AND THE DAY OF SURGERY    Please keep in mind the following:  DO NOT shave, including legs and underarms, 48 hours prior to surgery.   You may shave your face before/day of surgery.  Place clean sheets on your bed the night before surgery Use a clean washcloth (not used since being washed) for each shower. DO NOT sleep with pet's night before surgery.  CHG Shower Instructions:  Wash your  face and private area with normal soap. If you choose to wash your hair, wash first with your normal shampoo.  After you use shampoo/soap, rinse your hair and body thoroughly to remove shampoo/soap residue.  Turn the water OFF and apply half the bottle of CHG soap to a CLEAN washcloth.  Apply CHG soap ONLY FROM YOUR NECK DOWN TO YOUR TOES (washing for 3-5 minutes)  DO NOT use CHG soap on face, private areas, open wounds, or sores.  Pay special attention to the area where your surgery is being performed.  If you are having back surgery, having someone wash your back for you may be helpful. Wait 2 minutes after CHG soap is applied, then you may rinse off the CHG soap.  Pat dry with a clean towel  Put on clean pajamas  Additional instructions for the day of surgery: DO NOT APPLY any lotions, deodorants, cologne, or perfumes.   Do not wear jewelry or makeup Do not wear nail polish, gel polish, artificial nails, or any other type of covering on natural nails (fingers and toes) Do not bring valuables to the hospital. [facility] is not responsible for valuables/personal belongings. Put on clean/comfortable clothes.  Please brush your teeth.  Ask your nurse before applying any prescription medications to the skin.

## 2022-11-29 ENCOUNTER — Encounter (HOSPITAL_COMMUNITY)
Admission: RE | Admit: 2022-11-29 | Discharge: 2022-11-29 | Disposition: A | Discharge status: Home / Self Care | Payer: 59 | Source: Ambulatory Visit | Attending: Surgery | Admitting: Surgery

## 2022-11-29 ENCOUNTER — Other Ambulatory Visit: Payer: Self-pay

## 2022-11-29 ENCOUNTER — Encounter (HOSPITAL_COMMUNITY): Payer: Self-pay

## 2022-11-29 VITALS — BP 138/94 | HR 107 | Temp 98.5°F | Resp 17 | Ht 76.0 in | Wt 241.0 lb

## 2022-11-29 DIAGNOSIS — Z01818 Encounter for other preprocedural examination: Secondary | ICD-10-CM | POA: Insufficient documentation

## 2022-11-29 DIAGNOSIS — E119 Type 2 diabetes mellitus without complications: Secondary | ICD-10-CM | POA: Diagnosis not present

## 2022-11-29 HISTORY — DX: Unspecified asthma, uncomplicated: J45.909

## 2022-11-29 HISTORY — DX: Personal history of other diseases of the digestive system: Z87.19

## 2022-11-29 LAB — CBC
HCT: 45.9 % (ref 39.0–52.0)
Hemoglobin: 14.6 g/dL (ref 13.0–17.0)
MCH: 26.3 pg (ref 26.0–34.0)
MCHC: 31.8 g/dL (ref 30.0–36.0)
MCV: 82.7 fL (ref 80.0–100.0)
Platelets: 231 10*3/uL (ref 150–400)
RBC: 5.55 MIL/uL (ref 4.22–5.81)
RDW: 13.4 % (ref 11.5–15.5)
WBC: 8.6 10*3/uL (ref 4.0–10.5)
nRBC: 0 % (ref 0.0–0.2)

## 2022-11-29 LAB — BASIC METABOLIC PANEL
Anion gap: 10 (ref 5–15)
BUN: 11 mg/dL (ref 8–23)
CO2: 21 mmol/L — ABNORMAL LOW (ref 22–32)
Calcium: 9.2 mg/dL (ref 8.9–10.3)
Chloride: 108 mmol/L (ref 98–111)
Creatinine, Ser: 0.88 mg/dL (ref 0.61–1.24)
GFR, Estimated: >60 mL/min (ref >60)
Glucose, Bld: 129 mg/dL — ABNORMAL HIGH (ref 70–99)
Potassium: 3.9 mmol/L (ref 3.5–5.1)
Sodium: 139 mmol/L (ref 135–145)

## 2022-11-29 LAB — HEMOGLOBIN A1C
Hgb A1c MFr Bld: 7.7 % — ABNORMAL HIGH (ref 4.8–5.6)
Mean Plasma Glucose: 174.29 mg/dL

## 2022-11-29 LAB — GLUCOSE, CAPILLARY: Glucose-Capillary: 134 mg/dL — ABNORMAL HIGH (ref 70–99)

## 2022-11-29 NOTE — Progress Notes (Addendum)
PCP - Shirline Frees NP Cardiologist - denies  PPM/ICD - denies Device Orders - n/a Rep Notified - n/a  Chest x-ray - denies EKG - 11-29-22 Stress Test - denies ECHO - denies Cardiac Cath - denies  Sleep Study - denies CPAP - n/a  Fasting Blood Sugar - Between 110-112 per patient Blood sugar at PAT appointment 107 Checks Blood Sugar Has a Dexcom picking up at pharmacy   Last dose of GLP1 agonist-  tirzepatide Greggory Keen)  GLP1 instructions: Has not "taken for a while" per patient  Blood Thinner Instructions: denies Aspirin Instructions: n/a  ERAS Protcol - clear liquids until 10:00  COVID TEST- n/a   Anesthesia review: yes Hx of HTN, DM   Patient denies shortness of breath, fever, cough and chest pain at PAT appointment   All instructions explained to the patient, with a verbal understanding of the material. Patient agrees to go over the instructions while at home for a better understanding. Patient also instructed to self quarantine after being tested for COVID-19. The opportunity to ask questions was provided.

## 2022-11-30 ENCOUNTER — Ambulatory Visit: Payer: Self-pay | Admitting: Surgery

## 2022-12-03 ENCOUNTER — Other Ambulatory Visit: Payer: Self-pay

## 2022-12-03 ENCOUNTER — Encounter (HOSPITAL_COMMUNITY): Payer: Self-pay | Admitting: Surgery

## 2022-12-03 ENCOUNTER — Observation Stay (HOSPITAL_COMMUNITY)
Admission: RE | Admit: 2022-12-03 | Discharge: 2022-12-04 | Disposition: A | Discharge status: Home / Self Care | Payer: 59 | Attending: Surgery | Admitting: Surgery

## 2022-12-03 ENCOUNTER — Ambulatory Visit (HOSPITAL_BASED_OUTPATIENT_CLINIC_OR_DEPARTMENT_OTHER): Payer: 59 | Admitting: Certified Registered"

## 2022-12-03 ENCOUNTER — Encounter (HOSPITAL_COMMUNITY)
Admission: RE | Disposition: A | Discharge status: Home / Self Care | Payer: Self-pay | Source: Home / Self Care | Attending: Surgery

## 2022-12-03 ENCOUNTER — Ambulatory Visit (HOSPITAL_COMMUNITY): Payer: 59 | Admitting: Physician Assistant

## 2022-12-03 DIAGNOSIS — I10 Essential (primary) hypertension: Secondary | ICD-10-CM | POA: Insufficient documentation

## 2022-12-03 DIAGNOSIS — J45909 Unspecified asthma, uncomplicated: Secondary | ICD-10-CM | POA: Diagnosis not present

## 2022-12-03 DIAGNOSIS — Z8719 Personal history of other diseases of the digestive system: Principal | ICD-10-CM

## 2022-12-03 DIAGNOSIS — K439 Ventral hernia without obstruction or gangrene: Secondary | ICD-10-CM

## 2022-12-03 DIAGNOSIS — E119 Type 2 diabetes mellitus without complications: Secondary | ICD-10-CM | POA: Insufficient documentation

## 2022-12-03 DIAGNOSIS — K436 Other and unspecified ventral hernia with obstruction, without gangrene: Secondary | ICD-10-CM | POA: Diagnosis present

## 2022-12-03 HISTORY — PX: VENTRAL HERNIA REPAIR: SHX424

## 2022-12-03 LAB — GLUCOSE, CAPILLARY
Glucose-Capillary: 118 mg/dL — ABNORMAL HIGH (ref 70–99)
Glucose-Capillary: 113 mg/dL — ABNORMAL HIGH (ref 70–99)
Glucose-Capillary: 221 mg/dL — ABNORMAL HIGH (ref 70–99)

## 2022-12-03 LAB — HIV ANTIBODY (ROUTINE TESTING W REFLEX): HIV Screen 4th Generation wRfx: NONREACTIVE

## 2022-12-03 SURGERY — REPAIR, HERNIA, VENTRAL, LAPAROSCOPIC
Anesthesia: General

## 2022-12-03 MED ORDER — 0.9 % SODIUM CHLORIDE (POUR BTL) OPTIME
TOPICAL | Status: DC | PRN
  Administered 2022-12-03: 1000 mL

## 2022-12-03 MED ORDER — ENOXAPARIN SODIUM 40 MG/0.4ML IJ SOSY
40.0000 mg | PREFILLED_SYRINGE | Freq: Once | INTRAMUSCULAR | Status: AC
  Administered 2022-12-03: 40 mg via SUBCUTANEOUS

## 2022-12-03 MED ORDER — CHLORHEXIDINE GLUCONATE 0.12 % MT SOLN
OROMUCOSAL | Status: AC
  Administered 2022-12-03: 15 mL via OROMUCOSAL
  Filled 2022-12-03: qty 15

## 2022-12-03 MED ORDER — CHLORHEXIDINE GLUCONATE CLOTH 2 % EX PADS: 6.0000 | MEDICATED_PAD | Freq: Once | CUTANEOUS | Status: DC

## 2022-12-03 MED ORDER — DOCUSATE SODIUM 100 MG PO CAPS
100.0000 mg | ORAL_CAPSULE | Freq: Two times a day (BID) | ORAL | Status: DC
  Administered 2022-12-03 – 2022-12-04 (×2): 100 mg via ORAL
  Filled 2022-12-03 (×2): qty 1

## 2022-12-03 MED ORDER — PROPOFOL 10 MG/ML IV BOLUS
INTRAVENOUS | Status: DC | PRN
  Administered 2022-12-03: 150 mg via INTRAVENOUS

## 2022-12-03 MED ORDER — DEXMEDETOMIDINE HCL IN NACL 80 MCG/20ML IV SOLN
INTRAVENOUS | Status: DC | PRN
  Administered 2022-12-03: 8 ug via INTRAVENOUS

## 2022-12-03 MED ORDER — ONDANSETRON HCL 4 MG/2ML IJ SOLN
INTRAMUSCULAR | Status: AC
  Filled 2022-12-03: qty 2

## 2022-12-03 MED ORDER — KETAMINE HCL 50 MG/5ML IJ SOSY
PREFILLED_SYRINGE | INTRAMUSCULAR | Status: AC
  Filled 2022-12-03: qty 5

## 2022-12-03 MED ORDER — ENOXAPARIN SODIUM 40 MG/0.4ML IJ SOSY
40.0000 mg | PREFILLED_SYRINGE | INTRAMUSCULAR | Status: DC
  Administered 2022-12-04: 40 mg via SUBCUTANEOUS
  Filled 2022-12-03: qty 0.4

## 2022-12-03 MED ORDER — DIPHENHYDRAMINE HCL 50 MG/ML IJ SOLN
INTRAMUSCULAR | Status: DC | PRN
  Administered 2022-12-03: 12.5 mg via INTRAVENOUS

## 2022-12-03 MED ORDER — ROCURONIUM BROMIDE 10 MG/ML (PF) SYRINGE
PREFILLED_SYRINGE | INTRAVENOUS | Status: AC
  Filled 2022-12-03: qty 10

## 2022-12-03 MED ORDER — LABETALOL HCL 5 MG/ML IV SOLN
5.0000 mg | Freq: Once | INTRAVENOUS | Status: AC
  Administered 2022-12-03: 5 mg via INTRAVENOUS

## 2022-12-03 MED ORDER — OXYCODONE HCL 5 MG PO TABS: 5.0000 mg | ORAL_TABLET | ORAL | Status: DC | PRN

## 2022-12-03 MED ORDER — PROPOFOL 500 MG/50ML IV EMUL
INTRAVENOUS | Status: DC | PRN
  Administered 2022-12-03: 100 ug/kg/min via INTRAVENOUS

## 2022-12-03 MED ORDER — LACTATED RINGERS IV SOLN: INTRAVENOUS | Status: DC | PRN

## 2022-12-03 MED ORDER — ONDANSETRON HCL 4 MG/2ML IJ SOLN: 4.0000 mg | Freq: Once | INTRAMUSCULAR | Status: DC | PRN

## 2022-12-03 MED ORDER — PHENYLEPHRINE 80 MCG/ML (10ML) SYRINGE FOR IV PUSH (FOR BLOOD PRESSURE SUPPORT)
PREFILLED_SYRINGE | INTRAVENOUS | Status: DC | PRN
  Administered 2022-12-03 (×12): 80 ug via INTRAVENOUS

## 2022-12-03 MED ORDER — BUPIVACAINE LIPOSOME 1.3 % IJ SUSP
INTRAMUSCULAR | Status: AC
  Filled 2022-12-03: qty 20

## 2022-12-03 MED ORDER — BUPIVACAINE HCL (PF) 0.25 % IJ SOLN
INTRAMUSCULAR | Status: DC | PRN
  Administered 2022-12-03: 20 mL

## 2022-12-03 MED ORDER — ROCURONIUM BROMIDE 10 MG/ML (PF) SYRINGE
PREFILLED_SYRINGE | INTRAVENOUS | Status: DC | PRN
  Administered 2022-12-03: 10 mg via INTRAVENOUS
  Administered 2022-12-03: 50 mg via INTRAVENOUS
  Administered 2022-12-03: 20 mg via INTRAVENOUS

## 2022-12-03 MED ORDER — METHOCARBAMOL 750 MG PO TABS
750.0000 mg | ORAL_TABLET | Freq: Four times a day (QID) | ORAL | Status: DC
  Administered 2022-12-03 – 2022-12-04 (×4): 750 mg via ORAL
  Filled 2022-12-03 (×4): qty 1

## 2022-12-03 MED ORDER — INFLUENZA VAC A&B SURF ANT ADJ 0.5 ML IM SUSY: 0.5000 mL | PREFILLED_SYRINGE | INTRAMUSCULAR | Status: DC | PRN

## 2022-12-03 MED ORDER — CHLORHEXIDINE GLUCONATE 0.12 % MT SOLN: 15.0000 mL | Freq: Once | OROMUCOSAL | Status: AC

## 2022-12-03 MED ORDER — DIPHENHYDRAMINE HCL 50 MG/ML IJ SOLN
INTRAMUSCULAR | Status: AC
  Filled 2022-12-03: qty 1

## 2022-12-03 MED ORDER — ACETAMINOPHEN 500 MG PO TABS: 1000.0000 mg | ORAL_TABLET | ORAL | Status: AC

## 2022-12-03 MED ORDER — BUPIVACAINE HCL (PF) 0.25 % IJ SOLN
INTRAMUSCULAR | Status: AC
  Filled 2022-12-03: qty 30

## 2022-12-03 MED ORDER — ORAL CARE MOUTH RINSE: 15.0000 mL | Freq: Once | OROMUCOSAL | Status: AC

## 2022-12-03 MED ORDER — SUCCINYLCHOLINE CHLORIDE 200 MG/10ML IV SOSY
PREFILLED_SYRINGE | INTRAVENOUS | Status: DC | PRN
  Administered 2022-12-03: 140 mg via INTRAVENOUS

## 2022-12-03 MED ORDER — ONDANSETRON HCL 4 MG/2ML IJ SOLN
INTRAMUSCULAR | Status: DC | PRN
  Administered 2022-12-03: 4 mg via INTRAVENOUS

## 2022-12-03 MED ORDER — BUPIVACAINE LIPOSOME 1.3 % IJ SUSP: 20.0000 mL | Freq: Once | INTRAMUSCULAR | Status: DC

## 2022-12-03 MED ORDER — LABETALOL HCL 5 MG/ML IV SOLN
INTRAVENOUS | Status: AC
  Filled 2022-12-03: qty 4

## 2022-12-03 MED ORDER — MIDAZOLAM HCL 2 MG/2ML IJ SOLN
INTRAMUSCULAR | Status: AC
  Filled 2022-12-03: qty 2

## 2022-12-03 MED ORDER — DROPERIDOL 2.5 MG/ML IJ SOLN: 0.6250 mg | Freq: Once | INTRAMUSCULAR | Status: DC | PRN

## 2022-12-03 MED ORDER — DEXAMETHASONE SODIUM PHOSPHATE 10 MG/ML IJ SOLN
INTRAMUSCULAR | Status: AC
  Filled 2022-12-03: qty 1

## 2022-12-03 MED ORDER — DEXAMETHASONE SODIUM PHOSPHATE 10 MG/ML IJ SOLN
INTRAMUSCULAR | Status: DC | PRN
  Administered 2022-12-03: 10 mg via INTRAVENOUS

## 2022-12-03 MED ORDER — FENTANYL CITRATE (PF) 250 MCG/5ML IJ SOLN
INTRAMUSCULAR | Status: DC | PRN
  Administered 2022-12-03: 50 ug via INTRAVENOUS

## 2022-12-03 MED ORDER — HYDROMORPHONE HCL 1 MG/ML IJ SOLN
INTRAMUSCULAR | Status: AC
  Filled 2022-12-03: qty 0.5

## 2022-12-03 MED ORDER — LIDOCAINE 2% (20 MG/ML) 5 ML SYRINGE
INTRAMUSCULAR | Status: DC | PRN
  Administered 2022-12-03: 20 mg via INTRAVENOUS
  Administered 2022-12-03: 60 mg via INTRAVENOUS

## 2022-12-03 MED ORDER — BUPIVACAINE LIPOSOME 1.3 % IJ SUSP
INTRAMUSCULAR | Status: DC | PRN
  Administered 2022-12-03: 20 mL

## 2022-12-03 MED ORDER — OXYCODONE HCL 5 MG PO TABS: 5.0000 mg | ORAL_TABLET | Freq: Once | ORAL | Status: DC | PRN

## 2022-12-03 MED ORDER — CEFAZOLIN SODIUM-DEXTROSE 2-4 GM/100ML-% IV SOLN
2.0000 g | INTRAVENOUS | Status: AC
  Administered 2022-12-03: 2 g via INTRAVENOUS

## 2022-12-03 MED ORDER — MIDAZOLAM HCL 2 MG/2ML IJ SOLN
INTRAMUSCULAR | Status: DC | PRN
  Administered 2022-12-03: 2 mg via INTRAVENOUS

## 2022-12-03 MED ORDER — SUGAMMADEX SODIUM 200 MG/2ML IV SOLN
INTRAVENOUS | Status: DC | PRN
  Administered 2022-12-03: 400 mg via INTRAVENOUS

## 2022-12-03 MED ORDER — FENTANYL CITRATE (PF) 250 MCG/5ML IJ SOLN
INTRAMUSCULAR | Status: AC
  Filled 2022-12-03: qty 5

## 2022-12-03 MED ORDER — ENOXAPARIN SODIUM 40 MG/0.4ML IJ SOSY
PREFILLED_SYRINGE | INTRAMUSCULAR | Status: AC
  Filled 2022-12-03: qty 0.4

## 2022-12-03 MED ORDER — EPHEDRINE SULFATE (PRESSORS) 50 MG/ML IJ SOLN
INTRAMUSCULAR | Status: DC | PRN
  Administered 2022-12-03: 5 mg via INTRAVENOUS

## 2022-12-03 MED ORDER — OXYCODONE HCL 5 MG/5ML PO SOLN: 5.0000 mg | Freq: Once | ORAL | Status: DC | PRN

## 2022-12-03 MED ORDER — LABETALOL HCL 5 MG/ML IV SOLN
INTRAVENOUS | Status: DC | PRN
  Administered 2022-12-03: 10 mg via INTRAVENOUS
  Administered 2022-12-03: 5 mg via INTRAVENOUS

## 2022-12-03 MED ORDER — ACETAMINOPHEN 500 MG PO TABS
ORAL_TABLET | ORAL | Status: AC
  Administered 2022-12-03: 1000 mg via ORAL
  Filled 2022-12-03: qty 2

## 2022-12-03 MED ORDER — ACETAMINOPHEN 500 MG PO TABS
1000.0000 mg | ORAL_TABLET | Freq: Four times a day (QID) | ORAL | Status: DC
  Administered 2022-12-03 – 2022-12-04 (×3): 1000 mg via ORAL
  Filled 2022-12-03 (×3): qty 2

## 2022-12-03 MED ORDER — HYDROMORPHONE HCL 1 MG/ML IJ SOLN
INTRAMUSCULAR | Status: DC | PRN
  Administered 2022-12-03 (×3): .5 mg via INTRAVENOUS

## 2022-12-03 MED ORDER — FENTANYL CITRATE (PF) 100 MCG/2ML IJ SOLN: 25.0000 ug | INTRAMUSCULAR | Status: DC | PRN

## 2022-12-03 MED ORDER — CEFAZOLIN SODIUM-DEXTROSE 2-4 GM/100ML-% IV SOLN
INTRAVENOUS | Status: AC
  Filled 2022-12-03: qty 100

## 2022-12-03 MED ORDER — INSULIN ASPART 100 UNIT/ML IJ SOLN: 0.0000 [IU] | INTRAMUSCULAR | Status: DC | PRN

## 2022-12-03 SURGICAL SUPPLY — 53 items
ADH SKN CLS APL DERMABOND .7 (GAUZE/BANDAGES/DRESSINGS) ×1
ADH SKN CLS LQ APL DERMABOND (GAUZE/BANDAGES/DRESSINGS) ×1
APL PRP STRL LF DISP 70% ISPRP (MISCELLANEOUS) ×1
BAG COUNTER SPONGE SURGICOUNT (BAG) ×1 IMPLANT
BAG SPNG CNTER NS LX DISP (BAG) ×1
BINDER ABDOMINAL 12 ML 46-62 (SOFTGOODS) IMPLANT
CANISTER SUCT 3000ML PPV (MISCELLANEOUS) IMPLANT
CHLORAPREP W/TINT 26 (MISCELLANEOUS) ×1 IMPLANT
COVER SURGICAL LIGHT HANDLE (MISCELLANEOUS) ×1 IMPLANT
DERMABOND ADVANCED .7 DNX12 (GAUZE/BANDAGES/DRESSINGS) ×1 IMPLANT
DERMABOND ADVANCED .7 DNX6 (GAUZE/BANDAGES/DRESSINGS) IMPLANT
DEVICE SECURE STRAP 25 ABSORB (INSTRUMENTS) IMPLANT
DRAPE INCISE IOBAN 66X45 STRL (DRAPES) ×1 IMPLANT
ELECT CAUTERY BLADE 6.4 (BLADE) IMPLANT
ELECT REM PT RETURN 9FT ADLT (ELECTROSURGICAL) ×1
ELECTRODE REM PT RTRN 9FT ADLT (ELECTROSURGICAL) ×1 IMPLANT
GLOVE BIO SURGEON STRL SZ 6.5 (GLOVE) ×1 IMPLANT
GLOVE BIOGEL PI IND STRL 6 (GLOVE) ×1 IMPLANT
GOWN STRL REUS W/ TWL LRG LVL3 (GOWN DISPOSABLE) ×3 IMPLANT
GOWN STRL REUS W/TWL LRG LVL3 (GOWN DISPOSABLE) ×3
GRASPER SUT TROCAR 14GX15 (MISCELLANEOUS) ×1 IMPLANT
IRRIG SUCT STRYKERFLOW 2 WTIP (MISCELLANEOUS)
IRRIGATION SUCT STRKRFLW 2 WTP (MISCELLANEOUS) IMPLANT
KIT BASIN OR (CUSTOM PROCEDURE TRAY) ×1 IMPLANT
KIT TURNOVER KIT B (KITS) ×1 IMPLANT
MARKER SKIN DUAL TIP RULER LAB (MISCELLANEOUS) ×1 IMPLANT
MESH VENTRALIGHT ST 6X8 (Mesh Specialty) ×1 IMPLANT
MESH VENTRLGHT ELLIPSE 8X6XMFL (Mesh Specialty) IMPLANT
NDL INSUFFLATION 14GA 120MM (NEEDLE) ×1 IMPLANT
NDL SPNL 22GX3.5 QUINCKE BK (NEEDLE) ×1 IMPLANT
NEEDLE INSUFFLATION 14GA 120MM (NEEDLE) ×1
NEEDLE SPNL 22GX3.5 QUINCKE BK (NEEDLE) ×1
NS IRRIG 1000ML POUR BTL (IV SOLUTION) ×1 IMPLANT
PAD ARMBOARD 7.5X6 YLW CONV (MISCELLANEOUS) ×2 IMPLANT
PENCIL BUTTON HOLSTER BLD 10FT (ELECTRODE) IMPLANT
SCISSORS LAP 5X35 DISP (ENDOMECHANICALS) IMPLANT
SET TUBE SMOKE EVAC HIGH FLOW (TUBING) ×1 IMPLANT
SHEARS HARMONIC ACE PLUS 36CM (ENDOMECHANICALS) IMPLANT
SLEEVE Z-THREAD 5X100MM (TROCAR) ×2 IMPLANT
SUT ETHIBOND 0 MO6 C/R (SUTURE) ×1 IMPLANT
SUT MNCRL AB 4-0 PS2 18 (SUTURE) ×1 IMPLANT
SUT NOVA 1 T20/GS 25DT (SUTURE) ×1 IMPLANT
SUT VIC AB 3-0 SH 27 (SUTURE)
SUT VIC AB 3-0 SH 27XBRD (SUTURE) IMPLANT
TOWEL GREEN STERILE (TOWEL DISPOSABLE) ×1 IMPLANT
TOWEL GREEN STERILE FF (TOWEL DISPOSABLE) ×1 IMPLANT
TRAY FOLEY W/BAG SLVR 16FR (SET/KITS/TRAYS/PACK)
TRAY FOLEY W/BAG SLVR 16FR ST (SET/KITS/TRAYS/PACK) IMPLANT
TRAY LAPAROSCOPIC MC (CUSTOM PROCEDURE TRAY) ×1 IMPLANT
TROCAR 11X100 Z THREAD (TROCAR) IMPLANT
TROCAR Z-THREAD OPTICAL 5X100M (TROCAR) ×1 IMPLANT
WARMER LAPAROSCOPE (MISCELLANEOUS) ×1 IMPLANT
WATER STERILE IRR 1000ML POUR (IV SOLUTION) ×1 IMPLANT

## 2022-12-03 NOTE — Anesthesia Procedure Notes (Addendum)
Procedure Name: Intubation Date/Time: 12/03/2022 1:20 PM  Performed by: Jimmey Ralph, CRNAPre-anesthesia Checklist: Patient identified, Emergency Drugs available, Suction available and Patient being monitored Patient Re-evaluated:Patient Re-evaluated prior to induction Oxygen Delivery Method: Circle System Utilized Preoxygenation: Pre-oxygenation with 100% oxygen Induction Type: IV induction Ventilation: Unable to mask ventilate and Two handed mask ventilation required Laryngoscope Size: Glidescope and 4 Grade View: Grade III Tube type: Oral Tube size: 7.5 mm Number of attempts: 1 Airway Equipment and Method: Stylet and Oral airway Placement Confirmation: ETT inserted through vocal cords under direct vision, positive ETCO2 and breath sounds checked- equal and bilateral Tube secured with: Tape Dental Injury: Injury to lip  Difficulty Due To: Difficulty was anticipated, Difficult Airway- due to anterior larynx and Difficult Airway- due to limited oral opening Future Recommendations: Recommend- induction with short-acting agent, and alternative techniques readily available Comments: Limited opening of mouth c sux ; about one half inch and large epiglottis obstructing anterior view of bottom half of arytenoids.

## 2022-12-03 NOTE — Anesthesia Postprocedure Evaluation (Signed)
Anesthesia Post Note  Patient: Christopher Serrano  Procedure(s) Performed: LAPAROSCOPIC VENTRAL HERNIA REPAIR WITH MESH     Patient location during evaluation: PACU Anesthesia Type: General Level of consciousness: awake and alert and oriented Pain management: pain level controlled Vital Signs Assessment: post-procedure vital signs reviewed and stable Respiratory status: spontaneous breathing, nonlabored ventilation and respiratory function stable Cardiovascular status: blood pressure returned to baseline and stable Postop Assessment: no apparent nausea or vomiting Anesthetic complications: yes   Encounter Notable Events  Notable Event Outcome Phase Comment  Difficult to intubate - expected  Intraprocedure Filed from anesthesia note documentation.    Last Vitals:  Vitals:   12/03/22 1610 12/03/22 1634  BP: (!) 134/94 (!) 141/104  Pulse: 85 88  Resp: 10 18  Temp: (!) 36.4 C (!) 36.3 C  SpO2: 97% 99%    Last Pain:  Vitals:   12/03/22 1634  TempSrc: Oral  PainSc:                  [name] A.

## 2022-12-03 NOTE — Op Note (Signed)
   Operative Note   Date: 12/03/2022  Procedure: laparoscopic ventral hernia repair with mesh  Pre-op diagnosis: incarcerated ventral hernia Post-op diagnosis: same  Indication and clinical history: The patient is a 67 y.o. year old male with incarcerated ventral hernia     Surgeon: Diamantina Monks, MD  Anesthesiologist: Malen Gauze, MD Anesthesia: General  Findings:  Specimen: none EBL: <5cc Drains/Implants: 6"x8"" ventralight mesh  Disposition: PACU - hemodynamically stable.  Description of procedure: The patient was positioned supine on the operating room table. General anesthetic induction and intubation were uneventful. Foley catheter insertion was performed and was atraumatic. Time-out was performed verifying correct patient, procedure, signature of informed consent, and administration of pre-operative antibiotics.  The abdomen was prepped and draped in the usual sterile fashion. The abdomen was entered using a Veress needle and an optiview technique with a 5mm port in the left upper quadrant. The abdomen was insufflated and inspected, confirming no intra-abdominal injury on entry. Two additional 5mm ports placed under direct visualization in the left abdomen. Local anesthetic was infiltrated at each of these sites prior to port placement.  The hernia contained incarcerated fat and densely adherent small bowel. Sharp dissection was performed to reduce the hernia contents into the abdominal cavity.  The fascia was exposed circumferentially and measured 9x8cm. The mesh was sutured in 4 quadrants and oriented.  This was inserted into the abdomen and again orientation confirmed.  The mesh was secured to the abdominal wall using the previously placed sutures transfascial using a suture passer.  An absorbable tacker was used to circumferentially secure the remainder of the mesh to the abdominal wall.  The mesh was flat.  The abdomen was desufflated and the mesh visualized during desufflation  and confirmed to remain flat.  All ports were removed the skin was closed using 4-0 Monocryl suture and Dermabond.  Sterile dressings were applied. All sponge and instrument counts were correct at the conclusion of the procedure. The patient was awakened from anesthesia, extubated uneventfully, and transported to the PACU in good condition. There were no complications.      Diamantina Monks, MD General and Trauma Surgery [facility]

## 2022-12-03 NOTE — H&P (Signed)
    Christopher Serrano is an 67 y.o. male.   HPI: 79M with VH. Plan lap VHR with mesh. The patient has had no hospitalizations, doctors visits, ER visits, surgeries, or newly diagnosed allergies since being seen in the office.    Past Medical History:  Diagnosis Date   Allergy    ASTHMA 07/25/2006   Asthma    Depression    DIABETES MELLITUS, TYPE II 07/25/2006   History of hiatal hernia    HYPERLIPIDEMIA 01/07/2008   HYPERTENSION 07/25/2006    Past Surgical History:  Procedure Laterality Date   SINUS IRRIGATION      Family History  Problem Relation Age of Onset   Hypertension Mother    Dementia Father    Diabetes Brother    Colon cancer Neg Hx    Esophageal cancer Neg Hx    Pancreatic cancer Neg Hx    Rectal cancer Neg Hx    Stomach cancer Neg Hx     Social History:  reports that he has never smoked. He has never used smokeless tobacco. He reports current alcohol use. He reports that he does not use drugs.  Allergies:  Allergies  Allergen Reactions   Aleve [Naproxen] Shortness Of Breath   Aspirin Shortness Of Breath   Ibuprofen Shortness Of Breath    Medications: I have reviewed the patient's current medications.  Results for orders placed or performed during the hospital encounter of 12/03/22 (from the past 48 hour(s))  Glucose, capillary     Status: Abnormal   Collection Time: 12/03/22 11:24 AM  Result Value Ref Range   Glucose-Capillary 118 (H) 70 - 99 mg/dL    Comment: Glucose reference range applies only to samples taken after fasting for at least 8 hours.    No results found.  ROS 10 point review of systems is negative except as listed above in HPI.   Physical Exam Blood pressure (!) 147/110, pulse 94, temperature 98.1 F (36.7 C), resp. rate 18, height 6\' 4"  (1.93 m), weight 110.2 kg, SpO2 99%. Constitutional: well-developed, well-nourished HEENT: pupils equal, round, reactive to light, 2mm b/l, moist conjunctiva, external inspection of ears and nose  normal, hearing intact Oropharynx: normal oropharyngeal mucosa, normal dentition Neck: no thyromegaly, trachea midline, no midline cervical tenderness to palpation Chest: breath sounds equal bilaterally, normal respiratory effort, no midline or lateral chest wall tenderness to palpation/deformity Abdomen: soft, NT, no bruising, no hepatosplenomegaly Skin: warm, dry, no rashes Psych: normal memory, normal mood/affect     Assessment/Plan: VH  VH - plan lap VHR with mesh. Informed consent was obtained after detailed explanation of risks, including bleeding, infection, hematoma/seroma, temporary or permanent neuropathy, hernia recurrence, and mesh infection requiring explantation. All questions answered to the patient's satisfaction. FEN - strict NPO DVT - SCDs, LMWH Dispo -  home post-op     Diamantina Monks, MD General and Trauma Surgery [facility]

## 2022-12-03 NOTE — Anesthesia Preprocedure Evaluation (Addendum)
Anesthesia Evaluation  Patient identified by MRN, date of birth, ID band Patient awake    Reviewed: Allergy & Precautions, NPO status , Patient's Chart, lab work & pertinent test results  Airway Mallampati: II  TM Distance: >3 FB     Dental  (+) Partial Upper, Missing, Dental Advisory Given   Pulmonary asthma    Pulmonary exam normal breath sounds clear to auscultation       Cardiovascular hypertension, Pt. on medications  Rhythm:Regular Rate:Normal  EKG 11/29/22 ST with PVC's, inferior infarct   Neuro/Psych  PSYCHIATRIC DISORDERS  Depression    negative neurological ROS     GI/Hepatic Neg liver ROS, hiatal hernia,,,  Endo/Other  diabetes, Well Controlled, Type 2, Oral Hypoglycemic Agents    Renal/GU negative Renal ROS  negative genitourinary   Musculoskeletal Ventral hernia   Abdominal  (+) + obese  Peds  Hematology negative hematology ROS (+)   Anesthesia Other Findings   Reproductive/Obstetrics                             Anesthesia Physical Anesthesia Plan  ASA: 2  Anesthesia Plan: General   Post-op Pain Management: Dilaudid IV, Ofirmev IV (intra-op)* and Precedex   Induction: Intravenous  PONV Risk Score and Plan: 4 or greater and Treatment may vary due to age or medical condition, Ondansetron, Dexamethasone and Midazolam  Airway Management Planned: Oral ETT  Additional Equipment: None  Intra-op Plan:   Post-operative Plan: Extubation in OR  Informed Consent: I have reviewed the patients History and Physical, chart, labs and discussed the procedure including the risks, benefits and alternatives for the proposed anesthesia with the patient or authorized representative who has indicated his/her understanding and acceptance.     Dental advisory given  Plan Discussed with: Anesthesiologist and CRNA  Anesthesia Plan Comments:        Anesthesia Quick Evaluation

## 2022-12-03 NOTE — Transfer of Care (Signed)
Immediate Anesthesia Transfer of Care Note  Patient: Christopher Serrano  Procedure(s) Performed: LAPAROSCOPIC VENTRAL HERNIA REPAIR WITH MESH  Patient Location: PACU  Anesthesia Type:General  Level of Consciousness: awake, alert , oriented, and drowsy  Airway & Oxygen Therapy: Patient Spontanous Breathing and Patient connected to face mask  Post-op Assessment: Report given to RN and Post -op Vital signs reviewed and stable  Post vital signs: Reviewed and stable  Last Vitals:  Vitals Value Taken Time  BP 107/74 12/03/22 1515  Temp 36.4 C 12/03/22 1515  Pulse 89 12/03/22 1520  Resp 24 12/03/22 1520  SpO2 93 % 12/03/22 1520  Vitals shown include unfiled device data.  Last Pain:  Vitals:   12/03/22 1515  TempSrc:   PainSc: 0-No pain         Complications:  Encounter Notable Events  Notable Event Outcome Phase Comment  Difficult to intubate - expected  Intraprocedure Filed from anesthesia note documentation.

## 2022-12-03 NOTE — Plan of Care (Signed)

## 2022-12-04 ENCOUNTER — Encounter (HOSPITAL_COMMUNITY): Payer: Self-pay | Admitting: Surgery

## 2022-12-04 DIAGNOSIS — K436 Other and unspecified ventral hernia with obstruction, without gangrene: Secondary | ICD-10-CM | POA: Diagnosis not present

## 2022-12-04 LAB — CBC
HCT: 40.5 % (ref 39.0–52.0)
Hemoglobin: 13.1 g/dL (ref 13.0–17.0)
MCH: 26.7 pg (ref 26.0–34.0)
MCHC: 32.3 g/dL (ref 30.0–36.0)
MCV: 82.5 fL (ref 80.0–100.0)
Platelets: 227 10*3/uL (ref 150–400)
RBC: 4.91 MIL/uL (ref 4.22–5.81)
RDW: 13.5 % (ref 11.5–15.5)
WBC: 12.8 10*3/uL — ABNORMAL HIGH (ref 4.0–10.5)
nRBC: 0 % (ref 0.0–0.2)

## 2022-12-04 LAB — BASIC METABOLIC PANEL
Anion gap: 9 (ref 5–15)
BUN: 10 mg/dL (ref 8–23)
CO2: 23 mmol/L (ref 22–32)
Calcium: 8.8 mg/dL — ABNORMAL LOW (ref 8.9–10.3)
Chloride: 103 mmol/L (ref 98–111)
Creatinine, Ser: 0.92 mg/dL (ref 0.61–1.24)
GFR, Estimated: >60 mL/min (ref >60)
Glucose, Bld: 160 mg/dL — ABNORMAL HIGH (ref 70–99)
Potassium: 4.1 mmol/L (ref 3.5–5.1)
Sodium: 135 mmol/L (ref 135–145)

## 2022-12-04 LAB — GLUCOSE, CAPILLARY: Glucose-Capillary: 160 mg/dL — ABNORMAL HIGH (ref 70–99)

## 2022-12-04 MED ORDER — METHOCARBAMOL 750 MG PO TABS
750.0000 mg | ORAL_TABLET | Freq: Four times a day (QID) | ORAL | 1 refills | Status: AC
Start: 2022-12-04 — End: ?

## 2022-12-04 MED ORDER — ACETAMINOPHEN 500 MG PO TABS: 1000.0000 mg | ORAL_TABLET | Freq: Four times a day (QID) | ORAL | 3 refills | Status: AC

## 2022-12-04 MED ORDER — OXYCODONE HCL 5 MG PO TABS: 5.0000 mg | ORAL_TABLET | ORAL | 0 refills | Status: DC | PRN

## 2022-12-04 NOTE — Discharge Instructions (Signed)
[facility]  LAPAROSCOPIC SURGERY: POST OP INSTRUCTIONS Always review your discharge instruction sheet given to you by the facility where your surgery was performed. IF YOU HAVE DISABILITY OR FAMILY LEAVE FORMS, YOU MUST BRING THEM TO THE OFFICE FOR PROCESSING.   DO NOT GIVE THEM TO YOUR DOCTOR.  PAIN CONTROL  Pain regimen: take over-the-counter tylenol (acetaminophen) 1000mg  every six hours and the robaxin (methocarbamol) 750mg  every six hours. With both of these, you should be taking something every three hours. Example: tylenol ( acetaminophen) at 9am, robaxin (methocarbamol) at 12pm, tylenol (acetaminophen) again at 3pm, robaxin (methocarbamol) at 6pm. You also have a prescription for oxycodone, which should be taken if the tylenol (acetaminophen), ibuprofen, and robaxin (methocarbamol) are not enough to control your pain. You may take the oxycodone as frequently as every four hours as needed, but if you are taking the other medications as above, you should not need the oxycodone this frequently. You have also been given a prescription for colace (docusate) which is a stool softener. Please take this as prescribed because the oxycodone can cause constipation and the colace (docusate) will minimize or prevent constipation. Do not drive while taking or under the influence of the oxycodone as it is a narcotic medication. Use ice packs to help control pain. If you need a refill on your pain medication, please contact your pharmacy.  They will contact our office to request authorization. Prescriptions will not be filled after 5pm or on week-ends.  HOME MEDICATIONS Take your usually prescribed medications unless otherwise directed.  DIET You should follow a light diet the first few days after arrival home.  Be sure to include lots of fluids daily.   CONSTIPATION It is common to experience some constipation after surgery and if you are taking pain medication.  Increasing fluid  intake and taking a stool softener (such as Colace) will usually help or prevent this problem from occurring.  A mild laxative (Milk of Magnesia or Miralax) should be taken according to package instructions if there are no bowel movements after 48 hours.  WOUND/INCISION CARE Most patients will experience some swelling and bruising in the area of the incisions.  Ice packs will help.  Swelling and bruising can take several days to resolve.  May shower beginning 12/04/2022.  Do not peel off or scrub skin glue. May allow warm soapy water to run over incision, then rinse and pat dry.  Do not soak in any water (tubs, hot tubs, pools, lakes, oceans) for one week.   ACTIVITIES You may resume regular (light) daily activities beginning the next day--such as daily self-care, walking, climbing stairs--gradually increasing activities as tolerated.  You may have sexual intercourse when it is comfortable.   No lifting greater than 5 pounds for six weeks.  You may drive when you are no longer taking narcotic pain medication, you can comfortably wear a seatbelt, and you can safely maneuver your car and apply brakes.  FOLLOW-UP You should see your doctor in the office for a follow-up appointment approximately 2-3 weeks after your surgery.  You should have been given your post-op/follow-up appointment when your surgery was scheduled.  If you did not receive a post-op/follow-up appointment, make sure that you call for this appointment within a day or two after you arrive home to insure a convenient appointment time.  WHEN TO CALL YOUR DOCTOR: Fever over 101.5 Inability to urinate Continued bleeding from incision. Increased pain, redness, or drainage from the incision. Increasing abdominal pain  The clinic staff is available to answer your questions during regular business hours.  Please don't hesitate to call and ask to speak to one of the nurses for clinical concerns.  If you have a medical emergency, go to the  nearest emergency room or call 911.  A surgeon from [facility] is always on call at the hospital. [address], [address], [place], [place]  [address] ? [address] [address], [place], [place]   [address] [phone] ? [phone] ? FAX [phone] Web site: [email or web address]

## 2022-12-04 NOTE — Progress Notes (Signed)
General Surgery Follow Up Note  Subjective:    Overnight Issues:   Objective:  Vital signs for last 24 hours: Temp:  [97.3 F (36.3 C)-98.3 F (36.8 C)] 98 F (36.7 C) (11/09 0735) Pulse Rate:  [80-100] 88 (11/09 0735) Resp:  [10-19] 18 (11/09 0735) BP: (107-151)/(74-110) 131/92 (11/09 0735) SpO2:  [94 %-100 %] 98 % (11/09 0735) Weight:  [110.2 kg] 110.2 kg (11/08 1118)  Hemodynamic parameters for last 24 hours:    Intake/Output from previous day: 11/08 0701 - 11/09 0700 In: 900 [I.V.:900] Out: 205 [Urine:200; Blood:5]  Intake/Output this shift: Total I/O In: 120 [P.O.:120] Out: -   Vent settings for last 24 hours:    Physical Exam:  Gen: comfortable, no distress Neuro: follows commands, alert, communicative HEENT: PERRL Neck: supple CV: RRR Pulm: unlabored breathing on RA Abd: soft, NT, incision clean, dry, intact  GU: urine clear and yellow, +spontaneous void Extr: wwp, no edema  Results for orders placed or performed during the hospital encounter of 12/03/22 (from the past 24 hour(s))  Glucose, capillary     Status: Abnormal   Collection Time: 12/03/22 11:24 AM  Result Value Ref Range   Glucose-Capillary 118 (H) 70 - 99 mg/dL  Glucose, capillary     Status: Abnormal   Collection Time: 12/03/22  3:17 PM  Result Value Ref Range   Glucose-Capillary 113 (H) 70 - 99 mg/dL  HIV Antibody (routine testing w rflx)     Status: None   Collection Time: 12/03/22  6:23 PM  Result Value Ref Range   HIV Screen 4th Generation wRfx Non Reactive Non Reactive  Glucose, capillary     Status: Abnormal   Collection Time: 12/03/22  9:17 PM  Result Value Ref Range   Glucose-Capillary 221 (H) 70 - 99 mg/dL  Basic metabolic panel     Status: Abnormal   Collection Time: 12/04/22  4:42 AM  Result Value Ref Range   Sodium 135 135 - 145 mmol/L   Potassium 4.1 3.5 - 5.1 mmol/L   Chloride 103 98 - 111 mmol/L   CO2 23 22 - 32 mmol/L   Glucose, Bld 160 (H) 70 - 99 mg/dL   BUN  10 8 - 23 mg/dL   Creatinine, Ser 5.62 0.61 - 1.24 mg/dL   Calcium 8.8 (L) 8.9 - 10.3 mg/dL   GFR, Estimated >13 >08 mL/min   Anion gap 9 5 - 15  CBC     Status: Abnormal   Collection Time: 12/04/22  4:42 AM  Result Value Ref Range   WBC 12.8 (H) 4.0 - 10.5 K/uL   RBC 4.91 4.22 - 5.81 MIL/uL   Hemoglobin 13.1 13.0 - 17.0 g/dL   HCT 65.7 84.6 - 96.2 %   MCV 82.5 80.0 - 100.0 fL   MCH 26.7 26.0 - 34.0 pg   MCHC 32.3 30.0 - 36.0 g/dL   RDW 95.2 84.1 - 32.4 %   Platelets 227 150 - 400 K/uL   nRBC 0.0 0.0 - 0.2 %    Assessment & Plan:   Present on Admission: **None**    LOS: 0 days   Additional comments:I reviewed the patient's new clinical lab test results.   and I reviewed the patients new imaging test results.    Lap VHR - kept o/n due to densely adherent SB within hernia sac. Abdominal exam benign this AM. Passing gas.  FEN - regular diet DVT - SCDs, LMWH Dispo -  d/c home today. Clinical update  provided to wife via phone.      Diamantina Monks, MD Trauma & General Surgery Please use [email or web address] to contact on call provider  12/04/2022  *Care during the described time interval was provided by me. I have reviewed this patient's available data, including medical history, events of note, physical examination and test results as part of my evaluation.

## 2022-12-10 NOTE — Discharge Summary (Signed)
    Patient ID: Christopher Serrano [id number] [birth year] 67 y.o.  Admit date: 12/03/2022 Discharge date: 12/10/2022  Admitting Diagnosis: Lap VHR with mesh  Discharge Diagnosis Patient Active Problem List   Diagnosis Date Noted   Status post repair of ventral hernia 12/03/2022   History of colonic polyps 03/11/2015   Obesity 01/22/2014   Dyslipidemia 01/07/2008   Type 2 diabetes mellitus without complication (HCC) 07/25/2006   Essential hypertension 07/25/2006   Asthma 07/25/2006    Consultants none  Reason for Admission: Lap VHR with mesh  Procedures Lap VHR with mesh  Hospital Course:  uncomplicated    Physical Exam: Gen: comfortable, no distress Neuro: non-focal exam HEENT: PERRL Neck: supple CV: RRR Pulm: unlabored breathing Abd: soft, NT, incisions cdi GU: clear yellow urine Extr: wwp, no edema   Allergies as of 12/04/2022       Reactions   Aleve [naproxen] Shortness Of Breath   Aspirin Shortness Of Breath   Ibuprofen Shortness Of Breath        Medication List     TAKE these medications    acetaminophen 500 MG tablet Commonly known as: TYLENOL Take 2 tablets (1,000 mg total) by mouth 4 (four) times daily.   fluticasone 50 MCG/ACT nasal spray Commonly known as: FLONASE Place 2 sprays into both nostrils daily. What changed:  when to take this reasons to take this   FreeStyle Libre 3 Sensor Misc 1 Device by Does not apply route every 14 (fourteen) days. Place 1 sensor on the skin every 14 days. Use to check glucose continuously   glipiZIDE 10 MG 24 hr tablet Commonly known as: GLUCOTROL XL TAKE 1 TABLET(10 MG) BY MOUTH EVERY MORNING   ketoconazole 2 % cream Commonly known as: NIZORAL Apply to both feet and between toes once daily for 6 weeks.   lisinopril 20 MG tablet Commonly known as: ZESTRIL TAKE 1 TABLET(20 MG) BY MOUTH DAILY   metFORMIN 1000 MG tablet Commonly known as: GLUCOPHAGE TAKE 1 TABLET(1000 MG) BY MOUTH TWICE DAILY WITH  A MEAL   methocarbamol 750 MG tablet Commonly known as: Robaxin-750 Take 1 tablet (750 mg total) by mouth 4 (four) times daily.   multivitamin with minerals Tabs tablet Take 1 tablet by mouth daily.   oxyCODONE 5 MG immediate release tablet Commonly known as: Roxicodone Take 1 tablet (5 mg total) by mouth every 4 (four) hours as needed.   simvastatin 40 MG tablet Commonly known as: ZOCOR Take 1 tablet (40 mg total) by mouth daily at 6 PM.   tadalafil 20 MG tablet Commonly known as: CIALIS Take 0.5-1 tablets (10-20 mg total) by mouth every other day as needed for erectile dysfunction.   tirzepatide 7.5 MG/0.5ML Pen Commonly known as: MOUNJARO Inject 7.5 mg into the skin once a week.   Trelegy Ellipta 100-62.5-25 MCG/ACT Aepb Generic drug: Fluticasone-Umeclidin-Vilant Inhale 1 puff into the lungs daily.          Follow-up Information     Diamantina Monks, MD Follow up in 2 week(s).   Specialty: Surgery Contact information: [address] [place] [address] [facility] [place] [address] [phone]                  Signed: Diamantina Monks, MD [facility] 12/10/2022, 9:32 PM

## 2023-01-14 ENCOUNTER — Encounter: Payer: Self-pay | Admitting: Adult Health

## 2023-01-14 ENCOUNTER — Ambulatory Visit (INDEPENDENT_AMBULATORY_CARE_PROVIDER_SITE_OTHER): Payer: 59 | Admitting: Adult Health

## 2023-01-14 VITALS — BP 130/84 | HR 76 | Temp 98.3°F | Ht 76.0 in | Wt 239.0 lb

## 2023-01-14 DIAGNOSIS — Z7984 Long term (current) use of oral hypoglycemic drugs: Secondary | ICD-10-CM

## 2023-01-14 DIAGNOSIS — Z7985 Long-term (current) use of injectable non-insulin antidiabetic drugs: Secondary | ICD-10-CM | POA: Diagnosis not present

## 2023-01-14 DIAGNOSIS — N529 Male erectile dysfunction, unspecified: Secondary | ICD-10-CM

## 2023-01-14 DIAGNOSIS — H6122 Impacted cerumen, left ear: Secondary | ICD-10-CM

## 2023-01-14 DIAGNOSIS — I10 Essential (primary) hypertension: Secondary | ICD-10-CM | POA: Diagnosis not present

## 2023-01-14 DIAGNOSIS — E119 Type 2 diabetes mellitus without complications: Secondary | ICD-10-CM

## 2023-01-14 MED ORDER — TADALAFIL 20 MG PO TABS: 10.0000 mg | ORAL_TABLET | ORAL | 11 refills | Status: AC | PRN

## 2023-01-14 MED ORDER — TIRZEPATIDE 10 MG/0.5ML ~~LOC~~ SOAJ
10.0000 mg | SUBCUTANEOUS | 0 refills | Status: DC
Start: 2023-01-14 — End: 2023-04-28

## 2023-01-14 NOTE — Progress Notes (Signed)
Subjective:    Patient ID: Christopher Serrano, male    DOB: Apr 12, 1955, 67 y.o.   MRN: [id number]  HPI 67 year old male who  has a past medical history of Allergy, ASTHMA (07/25/2006), Asthma, Depression, DIABETES MELLITUS, TYPE II (07/25/2006), History of hiatal hernia, HYPERLIPIDEMIA (01/07/2008), and HYPERTENSION (07/25/2006).  DM Type 2 -managed with metformin 1000 mg twice daily, glipizide 10 mg ER daily, and Mounjaro 7.5 mg weekly.    He continues to eat a vegan diet. He was just released to go back to the gym after his hernia surgery. He had his A1c checked prior to surgery and it was 7.7.  Lab Results  Component Value Date   HGBA1C 7.7 (H) 11/29/2022   HGBA1C 7.0 (A) 07/15/2022   HGBA1C 6.7 (A) 01/06/2022   Wt Readings from Last 3 Encounters:  01/14/23 239 lb (108.4 kg)  12/03/22 243 lb (110.2 kg)  11/29/22 241 lb (109.3 kg)   HTN -managed with lisinopril 20 mg daily.  He denies dizziness, lightheadedness, chest pain, or shortness of breath. He does not monitor his BP at home. He is out of his medication   BP Readings from Last 3 Encounters:  01/14/23 (!) 160/90  12/04/22 (!) 131/92  11/29/22 (!) 138/94   Additionally he has a feeling of his left ear being full. Has not had any pain or drainage from the ear.   He also needs a refill of Cialis that he takes for ED  Review of Systems See HPI   Past Medical History:  Diagnosis Date   Allergy    ASTHMA 07/25/2006   Asthma    Depression    DIABETES MELLITUS, TYPE II 07/25/2006   History of hiatal hernia    HYPERLIPIDEMIA 01/07/2008   HYPERTENSION 07/25/2006    Social History   Socioeconomic History   Marital status: Married    Spouse name: Not on file   Number of children: Not on file   Years of education: Not on file   Highest education level: Not on file  Occupational History   Not on file  Tobacco Use   Smoking status: Never   Smokeless tobacco: Never  Substance and Sexual Activity   Alcohol use: Yes     Comment: every 6 -7 mths   Drug use: No   Sexual activity: Not on file  Other Topics Concern   Not on file  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    Social Drivers of Health   Financial Resource Strain: Not on file  Food Insecurity: No Food Insecurity (12/03/2022)   Hunger Vital Sign    Worried About Running Out of Food in the Last Year: Never true    Ran Out of Food in the Last Year: Never true  Transportation Needs: No Transportation Needs (12/03/2022)   PRAPARE - Administrator, Civil Service (Medical): No    Lack of Transportation (Non-Medical): No  Physical Activity: Not on file  Stress: Not on file  Social Connections: Not on file  Intimate Partner Violence: Not At Risk (12/03/2022)   Humiliation, Afraid, Rape, and Kick questionnaire    Fear of Current or Ex-Partner: No    Emotionally Abused: No    Physically Abused: No    Sexually Abused: No    Past Surgical History:  Procedure Laterality Date   SINUS IRRIGATION     VENTRAL HERNIA REPAIR N/A 12/03/2022   Procedure: LAPAROSCOPIC VENTRAL HERNIA REPAIR WITH MESH;  Surgeon: Kris Mouton  N, MD;  Location: [facility] OR;  Service: General;  Laterality: N/A;    Family History  Problem Relation Age of Onset   Hypertension Mother    Dementia Father    Diabetes Brother    Colon cancer Neg Hx    Esophageal cancer Neg Hx    Pancreatic cancer Neg Hx    Rectal cancer Neg Hx    Stomach cancer Neg Hx     Allergies  Allergen Reactions   Aleve [Naproxen] Shortness Of Breath   Aspirin Shortness Of Breath   Ibuprofen Shortness Of Breath    Current Outpatient Medications on File Prior to Visit  Medication Sig Dispense Refill   acetaminophen (TYLENOL) 500 MG tablet Take 2 tablets (1,000 mg total) by mouth 4 (four) times daily. 120 tablet 3   Continuous Blood Gluc Sensor (FREESTYLE LIBRE 3 SENSOR) MISC 1 Device by Does not apply route every 14 (fourteen) days. Place 1 sensor on the skin every 14 days. Use to check  glucose continuously 2 each 6   fluticasone (FLONASE) 50 MCG/ACT nasal spray Place 2 sprays into both nostrils daily. (Patient taking differently: Place 2 sprays into both nostrils daily as needed for allergies.) 16 g 6   Fluticasone-Umeclidin-Vilant (TRELEGY ELLIPTA) 100-62.5-25 MCG/ACT AEPB Inhale 1 puff into the lungs daily. 60 each 3   glipiZIDE (GLUCOTROL XL) 10 MG 24 hr tablet TAKE 1 TABLET(10 MG) BY MOUTH EVERY MORNING 90 tablet 0   ketoconazole (NIZORAL) 2 % cream Apply to both feet and between toes once daily for 6 weeks. 60 g 1   lisinopril (ZESTRIL) 20 MG tablet TAKE 1 TABLET(20 MG) BY MOUTH DAILY 90 tablet 0   metFORMIN (GLUCOPHAGE) 1000 MG tablet TAKE 1 TABLET(1000 MG) BY MOUTH TWICE DAILY WITH A MEAL 180 tablet 0   methocarbamol (ROBAXIN-750) 750 MG tablet Take 1 tablet (750 mg total) by mouth 4 (four) times daily. 120 tablet 1   Multiple Vitamin (MULTIVITAMIN WITH MINERALS) TABS tablet Take 1 tablet by mouth daily.     oxyCODONE (ROXICODONE) 5 MG immediate release tablet Take 1 tablet (5 mg total) by mouth every 4 (four) hours as needed. 15 tablet 0   simvastatin (ZOCOR) 40 MG tablet Take 1 tablet (40 mg total) by mouth daily at 6 PM. 90 tablet 3   tadalafil (CIALIS) 20 MG tablet Take 0.5-1 tablets (10-20 mg total) by mouth every other day as needed for erectile dysfunction. 10 tablet 11   tirzepatide (MOUNJARO) 7.5 MG/0.5ML Pen Inject 7.5 mg into the skin once a week. 6 mL 0   No current facility-administered medications on file prior to visit.    BP (!) 160/90   Pulse 76   Temp 98.3 F (36.8 C) (Oral)   Ht 6\' 4"  (1.93 m)   Wt 239 lb (108.4 kg)   SpO2 95%   BMI 29.09 kg/m       Objective:   Physical Exam Vitals and nursing note reviewed.  Constitutional:      Appearance: Normal appearance. He is obese.  HENT:     Right Ear: Tympanic membrane, ear canal and external ear normal.     Left Ear: There is impacted cerumen.  Cardiovascular:     Rate and Rhythm: Normal  rate and regular rhythm.     Pulses: Normal pulses.     Heart sounds: Normal heart sounds.  Pulmonary:     Effort: Pulmonary effort is normal.     Breath sounds: Normal breath sounds.  Skin:  General: Skin is warm and dry.  Neurological:     General: No focal deficit present.     Mental Status: He is alert and oriented to person, place, and time.  Psychiatric:        Mood and Affect: Mood normal.        Behavior: Behavior normal.        Thought Content: Thought content normal.        Judgment: Judgment normal.       Assessment & Plan:  1. Diabetes mellitus treated with oral medication (HCC) (Primary) - Continue with Metformin and Glipizide. Will increase Mounjaro to 10 mg. He is going to get back to the gym and start working out.  - Follow up in Feb 2025 for repeat A1c - tirzepatide (MOUNJARO) 10 MG/0.5ML Pen; Inject 10 mg into the skin once a week.  Dispense: 6 mL; Refill: 0  2. Long-term current use of injectable noninsulin antidiabetic medication  - tirzepatide (MOUNJARO) 10 MG/0.5ML Pen; Inject 10 mg into the skin once a week.  Dispense: 6 mL; Refill: 0  3. Essential hypertension - Controlled. No change in medication   4. Impacted cerumen of left ear Ear Cerumen Removal  Date/Time: 01/14/2023 3:00 PM  Performed by: Shirline Frees, NP Authorized by: Shirline Frees, NP   Anesthesia: Local Anesthetic: none Location details: left ear Patient tolerance: patient tolerated the procedure well with no immediate complications Procedure type: irrigation  Sedation: Patient sedated: no     5. Erectile dysfunction, unspecified erectile dysfunction type  - tadalafil (CIALIS) 20 MG tablet; Take 0.5-1 tablets (10-20 mg total) by mouth every other day as needed for erectile dysfunction.  Dispense: 10 tablet; Refill: 11

## 2023-01-21 ENCOUNTER — Other Ambulatory Visit: Payer: Self-pay

## 2023-01-21 DIAGNOSIS — E119 Type 2 diabetes mellitus without complications: Secondary | ICD-10-CM

## 2023-01-21 DIAGNOSIS — I10 Essential (primary) hypertension: Secondary | ICD-10-CM

## 2023-01-21 MED ORDER — METFORMIN HCL 1000 MG PO TABS: ORAL_TABLET | ORAL | 0 refills | Status: DC

## 2023-01-21 MED ORDER — LISINOPRIL 20 MG PO TABS: ORAL_TABLET | ORAL | 0 refills | Status: DC

## 2023-04-21 ENCOUNTER — Ambulatory Visit: Admitting: Adult Health

## 2023-04-23 ENCOUNTER — Other Ambulatory Visit: Payer: Self-pay | Admitting: Adult Health

## 2023-04-23 DIAGNOSIS — E119 Type 2 diabetes mellitus without complications: Secondary | ICD-10-CM

## 2023-04-24 ENCOUNTER — Other Ambulatory Visit: Payer: Self-pay | Admitting: Adult Health

## 2023-04-24 DIAGNOSIS — I10 Essential (primary) hypertension: Secondary | ICD-10-CM

## 2023-04-28 ENCOUNTER — Ambulatory Visit: Admitting: Adult Health

## 2023-04-28 VITALS — BP 130/82 | HR 94 | Temp 98.2°F | Ht 76.0 in | Wt 231.4 lb

## 2023-04-28 DIAGNOSIS — E119 Type 2 diabetes mellitus without complications: Secondary | ICD-10-CM

## 2023-04-28 DIAGNOSIS — Z7985 Long-term (current) use of injectable non-insulin antidiabetic drugs: Secondary | ICD-10-CM

## 2023-04-28 DIAGNOSIS — Z7984 Long term (current) use of oral hypoglycemic drugs: Secondary | ICD-10-CM

## 2023-04-28 DIAGNOSIS — I10 Essential (primary) hypertension: Secondary | ICD-10-CM | POA: Diagnosis not present

## 2023-04-28 LAB — BASIC METABOLIC PANEL WITH GFR
BUN: 14 mg/dL (ref 6–23)
CO2: 26 meq/L (ref 19–32)
Calcium: 9.4 mg/dL (ref 8.4–10.5)
Chloride: 100 meq/L (ref 96–112)
Creatinine, Ser: 1.02 mg/dL (ref 0.40–1.50)
GFR: 76.42 mL/min (ref >60.00)
Glucose, Bld: 291 mg/dL — ABNORMAL HIGH (ref 70–99)
Potassium: 3.9 meq/L (ref 3.5–5.1)
Sodium: 136 meq/L (ref 135–145)

## 2023-04-28 LAB — POCT GLYCOSYLATED HEMOGLOBIN (HGB A1C): Hemoglobin A1C: 11 % — AB (ref 4.0–5.6)

## 2023-04-28 LAB — HEMOGLOBIN A1C: Hgb A1c MFr Bld: 11.2 % — ABNORMAL HIGH (ref 4.6–6.5)

## 2023-04-28 MED ORDER — TIRZEPATIDE 12.5 MG/0.5ML ~~LOC~~ SOAJ
12.5000 mg | SUBCUTANEOUS | 0 refills | Status: DC
Start: 2023-04-28 — End: 2023-06-07

## 2023-04-28 MED ORDER — GLIPIZIDE ER 10 MG PO TB24: ORAL_TABLET | ORAL | 0 refills | Status: DC

## 2023-04-28 MED ORDER — FREESTYLE LIBRE 3 PLUS SENSOR MISC: 6 refills | Status: DC

## 2023-04-28 MED ORDER — METFORMIN HCL 1000 MG PO TABS: ORAL_TABLET | ORAL | 0 refills | Status: DC

## 2023-04-28 NOTE — Progress Notes (Signed)
 Subjective:    Patient ID: Christopher Serrano, male    DOB: 10-07-1955, 67 y.o.   MRN: [id number]  HPI 68 year old male who  has a past medical history of Allergy, ASTHMA (07/25/2006), Asthma, Depression, DIABETES MELLITUS, TYPE II (07/25/2006), History of hiatal hernia, HYPERLIPIDEMIA (01/07/2008), and HYPERTENSION (07/25/2006).  DM Type 2 -managed with metformin 1000 mg twice daily, glipizide 10 mg ER daily, and Mounjaro 10 mg weekly.    He continues to eat a vegan diet. His weight is down 20 pounds over the last year. He is riding his bike every day. He has been out of [facility] for about 2 weeks. Reports that his blood sugars have been in the " normal range".  Lab Results  Component Value Date   HGBA1C 11.0 (A) 04/28/2023   HGBA1C 7.7 (H) 11/29/2022   HGBA1C 7.0 (A) 07/15/2022   Wt Readings from Last 10 Encounters:  04/28/23 231 lb 6.4 oz (105 kg)  01/14/23 239 lb (108.4 kg)  12/03/22 243 lb (110.2 kg)  11/29/22 241 lb (109.3 kg)  08/05/22 245 lb (111.1 kg)  07/15/22 245 lb (111.1 kg)  02/12/22 249 lb (112.9 kg)  01/06/22 249 lb (112.9 kg)  10/07/21 242 lb (109.8 kg)  05/13/21 251 lb (113.9 kg)   HTN -managed with lisinopril 20 mg daily.  He denies dizziness, lightheadedness, chest pain, or shortness of breath. He does not monitor his BP at home. He is out of his medication   BP Readings from Last 3 Encounters:  04/28/23 130/82  01/14/23 130/84  12/04/22 (!) 131/92   Review of Systems See HPI   Past Medical History:  Diagnosis Date   Allergy    ASTHMA 07/25/2006   Asthma    Depression    DIABETES MELLITUS, TYPE II 07/25/2006   History of hiatal hernia    HYPERLIPIDEMIA 01/07/2008   HYPERTENSION 07/25/2006    Social History   Socioeconomic History   Marital status: Married    Spouse name: Not on file   Number of children: Not on file   Years of education: Not on file   Highest education level: Not on file  Occupational History   Not on file  Tobacco Use    Smoking status: Never   Smokeless tobacco: Never  Substance and Sexual Activity   Alcohol use: Yes    Comment: every 6 -7 mths   Drug use: No   Sexual activity: Not on file  Other Topics Concern   Not on file  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    Social Drivers of Health   Financial Resource Strain: Not on file  Food Insecurity: No Food Insecurity (12/03/2022)   Hunger Vital Sign    Worried About Running Out of Food in the Last Year: Never true    Ran Out of Food in the Last Year: Never true  Transportation Needs: No Transportation Needs (12/03/2022)   PRAPARE - Administrator, Civil Service (Medical): No    Lack of Transportation (Non-Medical): No  Physical Activity: Not on file  Stress: Not on file  Social Connections: Not on file  Intimate Partner Violence: Not At Risk (12/03/2022)   Humiliation, Afraid, Rape, and Kick questionnaire    Fear of Current or Ex-Partner: No    Emotionally Abused: No    Physically Abused: No    Sexually Abused: No    Past Surgical History:  Procedure Laterality Date   SINUS IRRIGATION  VENTRAL HERNIA REPAIR N/A 12/03/2022   Procedure: LAPAROSCOPIC VENTRAL HERNIA REPAIR WITH MESH;  Surgeon: Diamantina Monks, MD;  Location: [facility] OR;  Service: General;  Laterality: N/A;    Family History  Problem Relation Age of Onset   Hypertension Mother    Dementia Father    Diabetes Brother    Colon cancer Neg Hx    Esophageal cancer Neg Hx    Pancreatic cancer Neg Hx    Rectal cancer Neg Hx    Stomach cancer Neg Hx     Allergies  Allergen Reactions   Aleve [Naproxen] Shortness Of Breath   Aspirin Shortness Of Breath   Ibuprofen Shortness Of Breath    Current Outpatient Medications on File Prior to Visit  Medication Sig Dispense Refill   acetaminophen (TYLENOL) 500 MG tablet Take 2 tablets (1,000 mg total) by mouth 4 (four) times daily. 120 tablet 3   Continuous Blood Gluc Sensor (FREESTYLE LIBRE 3 SENSOR) MISC 1 Device  by Does not apply route every 14 (fourteen) days. Place 1 sensor on the skin every 14 days. Use to check glucose continuously 2 each 6   fluticasone (FLONASE) 50 MCG/ACT nasal spray Place 2 sprays into both nostrils daily. (Patient taking differently: Place 2 sprays into both nostrils daily as needed for allergies.) 16 g 6   Fluticasone-Umeclidin-Vilant (TRELEGY ELLIPTA) 100-62.5-25 MCG/ACT AEPB Inhale 1 puff into the lungs daily. 60 each 3   glipiZIDE (GLUCOTROL XL) 10 MG 24 hr tablet TAKE 1 TABLET(10 MG) BY MOUTH EVERY MORNING 90 tablet 0   ketoconazole (NIZORAL) 2 % cream Apply to both feet and between toes once daily for 6 weeks. 60 g 1   lisinopril (ZESTRIL) 20 MG tablet TAKE 1 TABLET(20 MG) BY MOUTH DAILY 90 tablet 0   metFORMIN (GLUCOPHAGE) 1000 MG tablet TAKE 1 TABLET(1000 MG) BY MOUTH TWICE DAILY WITH A MEAL 180 tablet 0   methocarbamol (ROBAXIN-750) 750 MG tablet Take 1 tablet (750 mg total) by mouth 4 (four) times daily. 120 tablet 1   Multiple Vitamin (MULTIVITAMIN WITH MINERALS) TABS tablet Take 1 tablet by mouth daily.     oxyCODONE (ROXICODONE) 5 MG immediate release tablet Take 1 tablet (5 mg total) by mouth every 4 (four) hours as needed. 15 tablet 0   simvastatin (ZOCOR) 40 MG tablet Take 1 tablet (40 mg total) by mouth daily at 6 PM. 90 tablet 3   tadalafil (CIALIS) 20 MG tablet Take 0.5-1 tablets (10-20 mg total) by mouth every other day as needed for erectile dysfunction. 10 tablet 11   No current facility-administered medications on file prior to visit.    BP 130/82   Pulse 94   Temp 98.2 F (36.8 C) (Oral)   Ht 6\' 4"  (1.93 m)   Wt 231 lb 6.4 oz (105 kg)   SpO2 98%   BMI 28.17 kg/m       Objective:   Physical Exam Vitals and nursing note reviewed.  Constitutional:      Appearance: Normal appearance. He is obese.  HENT:     Right Ear: Tympanic membrane, ear canal and external ear normal.  Cardiovascular:     Rate and Rhythm: Normal rate and regular rhythm.      Pulses: Normal pulses.     Heart sounds: Normal heart sounds.  Pulmonary:     Effort: Pulmonary effort is normal.     Breath sounds: Normal breath sounds.  Skin:    General: Skin is warm and dry.  Neurological:     General: No focal deficit present.     Mental Status: He is alert and oriented to person, place, and time.  Psychiatric:        Mood and Affect: Mood normal.        Behavior: Behavior normal.        Thought Content: Thought content normal.        Judgment: Judgment normal.       Assessment & Plan:   1. Diabetes mellitus treated with oral medication (HCC) (Primary)  - POC HgB A1c- 11.0. Had increased from 7.7. This does not seem right considering he has been taking his medications. He is exercising and eating healthy. I will run his A1c through lab.  - Increase Mounjaro to 12.5 mg  - Follow up in 3 months  - Hemoglobin A1c; Future - Basic Metabolic Panel; Future - Continuous Glucose Sensor (FREESTYLE LIBRE 3 PLUS SENSOR) MISC; Change sensor every 15 days.  Dispense: 2 each; Refill: 6 - glipiZIDE (GLUCOTROL XL) 10 MG 24 hr tablet; TAKE 1 TABLET(10 MG) BY MOUTH EVERY MORNING  Dispense: 90 tablet; Refill: 0 - metFORMIN (GLUCOPHAGE) 1000 MG tablet; TAKE 1 TABLET(1000 MG) BY MOUTH TWICE DAILY WITH A MEAL  Dispense: 180 tablet; Refill: 0  2. Long-term current use of injectable noninsulin antidiabetic medication  - POC HgB A1c - Hemoglobin A1c; Future - Basic Metabolic Panel; Future - tirzepatide (MOUNJARO) 12.5 MG/0.5ML Pen; Inject 12.5 mg into the skin once a week.  Dispense: 6 mL; Refill: 0 - Continuous Glucose Sensor (FREESTYLE LIBRE 3 PLUS SENSOR) MISC; Change sensor every 15 days.  Dispense: 2 each; Refill: 6  3. Essential hypertension - Controlled. No change   Shirline Frees, NP

## 2023-04-29 ENCOUNTER — Telehealth: Payer: Self-pay

## 2023-04-29 NOTE — Telephone Encounter (Signed)
 Copied from CRM [id number]. Topic: Clinical - Lab/Test Results >> Apr 29, 2023  1:51 PM Christopher Serrano wrote: Reason for CRM: Patient calling to review lab results. Relayed results verbatim per providers note. Patient verbalized understanding and has no additional questions at this time.   Patient did state that he has not received the [facility] yet, as he is waiting for approval from his insurance provider.

## 2023-05-03 NOTE — Telephone Encounter (Signed)
 Can someone assist with this PA for mounjaro.

## 2023-05-04 ENCOUNTER — Telehealth: Payer: Self-pay

## 2023-05-04 ENCOUNTER — Other Ambulatory Visit (HOSPITAL_COMMUNITY): Payer: Self-pay

## 2023-05-04 NOTE — Telephone Encounter (Signed)
 Pt stated he has the same insurance from last year. Pt insurance card is located in media from 02/15/2022.

## 2023-05-04 NOTE — Telephone Encounter (Signed)
 Called pt to advise of message below but no answer.

## 2023-05-04 NOTE — Telephone Encounter (Signed)
 Good morning we need Christopher Serrano prescription card info, I did an eligibility search and nothing came up and also looked in his documents.  Thanks

## 2023-05-04 NOTE — Telephone Encounter (Signed)
 Pharmacy Patient Advocate Encounter   Received notification from Pt Calls Messages that prior authorization for Mounjaro 12.5MG /0.5ML auto-injectors is required/requested.   Insurance verification completed.   The patient is insured through [facility] .   Per test claim: PA required and submitted KEY/EOC/Request #: [id number] CANCELLED due to: Your PA has been resolved, no additional PA is required.   Ran test claim, received paid claim. Insurance will only cover 1 month at a time. Called pharmacy. They received paid claim. Patient's copay is $75.

## 2023-05-04 NOTE — Telephone Encounter (Signed)
 PA request has been Started. New Encounter has been or will be created for follow up. For additional info see Pharmacy Prior Auth telephone encounter from 05/04/23.

## 2023-05-04 NOTE — Telephone Encounter (Signed)
 Copied from CRM [id number]. Topic: General - Other >> May 04, 2023 11:30 AM Elizebeth Brooking wrote: Reason for CRM: Patient called in returning a call from [facility], is requesting a callback

## 2023-05-04 NOTE — Telephone Encounter (Signed)
 Patient notified of update  and verbalized understanding.

## 2023-06-03 ENCOUNTER — Ambulatory Visit (INDEPENDENT_AMBULATORY_CARE_PROVIDER_SITE_OTHER): Payer: Self-pay | Admitting: Adult Health

## 2023-06-03 ENCOUNTER — Encounter: Payer: Self-pay | Admitting: Adult Health

## 2023-06-03 VITALS — BP 132/80 | HR 95 | Temp 98.4°F | Ht 76.0 in | Wt 233.0 lb

## 2023-06-03 DIAGNOSIS — E119 Type 2 diabetes mellitus without complications: Secondary | ICD-10-CM

## 2023-06-03 DIAGNOSIS — Z23 Encounter for immunization: Secondary | ICD-10-CM

## 2023-06-03 DIAGNOSIS — I10 Essential (primary) hypertension: Secondary | ICD-10-CM

## 2023-06-03 NOTE — Progress Notes (Signed)
 Subjective:    Patient ID: Christopher Serrano, male    DOB: 1955-07-10, 68 y.o.   MRN: [id number]  HPI 68 year old male who  has a past medical history of Allergy, ASTHMA (07/25/2006), Asthma, Depression, DIABETES MELLITUS, TYPE II (07/25/2006), History of hiatal hernia, HYPERLIPIDEMIA (01/07/2008), and HYPERTENSION (07/25/2006).  He presents to the office today for follow up regarding DM and HTN   He reports that he retired last week.   He was seen a month ago and his A1c had gone up form 7.7 to 11.2. Mounjaro  was increased from 10 mg to 12.5 mg and it was noticed that he had not been taking Glipizide  or Metformin . He is back on all of his medications and his libre sensor shows that he is in range 98% of the time.   HTN - he is managed with lisinopril  20 mg. He is tolerating this medication well and has no side effects BP Readings from Last 3 Encounters:  06/03/23 132/80  04/28/23 130/82  01/14/23 130/84    Review of Systems See HPI   Past Medical History:  Diagnosis Date   Allergy    ASTHMA 07/25/2006   Asthma    Depression    DIABETES MELLITUS, TYPE II 07/25/2006   History of hiatal hernia    HYPERLIPIDEMIA 01/07/2008   HYPERTENSION 07/25/2006    Social History   Socioeconomic History   Marital status: Married    Spouse name: Not on file   Number of children: Not on file   Years of education: Not on file   Highest education level: Not on file  Occupational History   Not on file  Tobacco Use   Smoking status: Never   Smokeless tobacco: Never  Substance and Sexual Activity   Alcohol use: Yes    Comment: every 6 -7 mths   Drug use: No   Sexual activity: Not on file  Other Topics Concern   Not on file  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    Social Drivers of Health   Financial Resource Strain: Not on file  Food Insecurity: No Food Insecurity (12/03/2022)   Hunger Vital Sign    Worried About Running Out of Food in the Last Year: Never true    Ran Out  of Food in the Last Year: Never true  Transportation Needs: No Transportation Needs (12/03/2022)   PRAPARE - Administrator, Civil Service (Medical): No    Lack of Transportation (Non-Medical): No  Physical Activity: Not on file  Stress: Not on file  Social Connections: Not on file  Intimate Partner Violence: Not At Risk (12/03/2022)   Humiliation, Afraid, Rape, and Kick questionnaire    Fear of Current or Ex-Partner: No    Emotionally Abused: No    Physically Abused: No    Sexually Abused: No    Past Surgical History:  Procedure Laterality Date   SINUS IRRIGATION     VENTRAL HERNIA REPAIR N/A 12/03/2022   Procedure: LAPAROSCOPIC VENTRAL HERNIA REPAIR WITH MESH;  Surgeon: Anda Bamberg, MD;  Location: [facility] OR;  Service: General;  Laterality: N/A;    Family History  Problem Relation Age of Onset   Hypertension Mother    Dementia Father    Diabetes Brother    Colon cancer Neg Hx    Esophageal cancer Neg Hx    Pancreatic cancer Neg Hx    Rectal cancer Neg Hx    Stomach cancer Neg Hx  Allergies  Allergen Reactions   Aleve [Naproxen] Shortness Of Breath   Aspirin Shortness Of Breath   Ibuprofen Shortness Of Breath    Current Outpatient Medications on File Prior to Visit  Medication Sig Dispense Refill   acetaminophen  (TYLENOL ) 500 MG tablet Take 2 tablets (1,000 mg total) by mouth 4 (four) times daily. 120 tablet 3   Continuous Blood Gluc Sensor (FREESTYLE LIBRE 3 SENSOR) MISC 1 Device by Does not apply route every 14 (fourteen) days. Place 1 sensor on the skin every 14 days. Use to check glucose continuously 2 each 6   Continuous Glucose Sensor (FREESTYLE LIBRE 3 PLUS SENSOR) MISC Change sensor every 15 days. 2 each 6   fluticasone  (FLONASE ) 50 MCG/ACT nasal spray Place 2 sprays into both nostrils daily. (Patient taking differently: Place 2 sprays into both nostrils daily as needed for allergies.) 16 g 6   Fluticasone -Umeclidin-Vilant (TRELEGY ELLIPTA )  100-62.5-25 MCG/ACT AEPB Inhale 1 puff into the lungs daily. 60 each 3   glipiZIDE  (GLUCOTROL  XL) 10 MG 24 hr tablet TAKE 1 TABLET(10 MG) BY MOUTH EVERY MORNING 90 tablet 0   ketoconazole  (NIZORAL ) 2 % cream Apply to both feet and between toes once daily for 6 weeks. 60 g 1   lisinopril  (ZESTRIL ) 20 MG tablet TAKE 1 TABLET(20 MG) BY MOUTH DAILY 90 tablet 0   metFORMIN  (GLUCOPHAGE ) 1000 MG tablet TAKE 1 TABLET(1000 MG) BY MOUTH TWICE DAILY WITH A MEAL 180 tablet 0   methocarbamol  (ROBAXIN -750) 750 MG tablet Take 1 tablet (750 mg total) by mouth 4 (four) times daily. 120 tablet 1   Multiple Vitamin (MULTIVITAMIN WITH MINERALS) TABS tablet Take 1 tablet by mouth daily.     oxyCODONE  (ROXICODONE ) 5 MG immediate release tablet Take 1 tablet (5 mg total) by mouth every 4 (four) hours as needed. 15 tablet 0   simvastatin  (ZOCOR ) 40 MG tablet Take 1 tablet (40 mg total) by mouth daily at 6 PM. 90 tablet 3   tadalafil  (CIALIS ) 20 MG tablet Take 0.5-1 tablets (10-20 mg total) by mouth every other day as needed for erectile dysfunction. 10 tablet 11   tirzepatide  (MOUNJARO ) 12.5 MG/0.5ML Pen Inject 12.5 mg into the skin once a week. 6 mL 0   No current facility-administered medications on file prior to visit.    BP 132/80   Pulse 95   Temp 98.4 F (36.9 C) (Oral)   Ht 6\' 4"  (1.93 m)   Wt 233 lb (105.7 kg)   SpO2 94%   BMI 28.36 kg/m       Objective:   Physical Exam Vitals and nursing note reviewed.  Constitutional:      Appearance: Normal appearance. He is obese.  Cardiovascular:     Rate and Rhythm: Normal rate and regular rhythm.     Pulses: Normal pulses.     Heart sounds: Normal heart sounds.  Pulmonary:     Effort: Pulmonary effort is normal.     Breath sounds: Normal breath sounds.  Skin:    General: Skin is warm and dry.  Neurological:     General: No focal deficit present.     Mental Status: He is alert and oriented to person, place, and time.  Psychiatric:        Mood and  Affect: Mood normal.        Behavior: Behavior normal.        Thought Content: Thought content normal.        Judgment: Judgment normal.  Assessment & Plan:  1. Type 2 diabetes mellitus without complication, without long-term current use of insulin  (HCC) (Primary) - better controlled.  - Follow up in July for repeat testing   2. Essential hypertension - Controlled. No change in medication   3. Need for shingles vaccine  - Zoster Recombinant (Shingrix )  4. Need for pneumococcal vaccine  - Pneumococcal conjugate vaccine 20-valent (Prevnar 20)  Markeya Mincy, NP

## 2023-06-03 NOTE — Patient Instructions (Signed)
 Your blood sugars look well controlled! Keep it up.   I would like to see you back in early July for repeat A1c testing

## 2023-06-06 ENCOUNTER — Telehealth: Payer: Self-pay | Admitting: Adult Health

## 2023-06-06 NOTE — Telephone Encounter (Signed)
 Copied from CRM [id number]. Topic: Referral - Request for Referral >> Jun 06, 2023 11:30 AM Wynona Hedger wrote: Did the patient discuss referral with their provider in the last year? No (If No - schedule appointment) (If Yes - send message)  Appointment offered? No  Type of order/referral and detailed reason for visit: Neurology  Preference of office, provider, location: No preference  If referral order, have you been seen by this specialty before? No (If Yes, this issue or another issue? When? Where?  Can we respond through MyChart? No

## 2023-06-07 ENCOUNTER — Other Ambulatory Visit: Payer: Self-pay | Admitting: Adult Health

## 2023-06-07 DIAGNOSIS — Z7985 Long-term (current) use of injectable non-insulin antidiabetic drugs: Secondary | ICD-10-CM

## 2023-06-07 NOTE — Telephone Encounter (Signed)
 Left message to return phone call.

## 2023-06-08 ENCOUNTER — Telehealth: Payer: Self-pay

## 2023-06-08 NOTE — Telephone Encounter (Signed)
 Patient notified of update  and verbalized understanding. Pt will call back to schedule.

## 2023-06-08 NOTE — Telephone Encounter (Signed)
 Copied from CRM [id number]. Topic: Referral - Request for Referral >> Jun 06, 2023 11:30 AM Wynona Hedger wrote: Did the patient discuss referral with their provider in the last year? No (If No - schedule appointment) (If Yes - send message)  Appointment offered? No  Type of order/referral and detailed reason for visit: Neurology  Preference of office, provider, location: No preference  If referral order, have you been seen by this specialty before? No (If Yes, this issue or another issue? When? Where?  Can we respond through MyChart? No >> Jun 06, 2023  3:21 PM Chuck Crater wrote: Christopher Serrano is calling for an update for patient.

## 2023-06-08 NOTE — Telephone Encounter (Signed)
 Spoke to pt and he stated that he need the referral to Neuro due to being confused when he wakes up. Pt stated that it takes for him to "snap out of it". Pt spouse is concerned.

## 2023-06-15 ENCOUNTER — Ambulatory Visit: Payer: Self-pay | Admitting: Adult Health

## 2023-06-29 ENCOUNTER — Other Ambulatory Visit: Payer: Self-pay | Admitting: Adult Health

## 2023-06-29 DIAGNOSIS — E785 Hyperlipidemia, unspecified: Secondary | ICD-10-CM

## 2023-07-23 ENCOUNTER — Other Ambulatory Visit: Payer: Self-pay | Admitting: Adult Health

## 2023-07-23 DIAGNOSIS — E119 Type 2 diabetes mellitus without complications: Secondary | ICD-10-CM

## 2023-07-25 ENCOUNTER — Other Ambulatory Visit: Payer: Self-pay | Admitting: Adult Health

## 2023-07-25 DIAGNOSIS — I10 Essential (primary) hypertension: Secondary | ICD-10-CM

## 2023-07-25 DIAGNOSIS — E119 Type 2 diabetes mellitus without complications: Secondary | ICD-10-CM

## 2023-09-28 ENCOUNTER — Ambulatory Visit (INDEPENDENT_AMBULATORY_CARE_PROVIDER_SITE_OTHER): Payer: PRIVATE HEALTH INSURANCE | Admitting: Adult Health

## 2023-09-28 ENCOUNTER — Encounter: Payer: Self-pay | Admitting: Adult Health

## 2023-09-28 VITALS — BP 138/86 | HR 84 | Temp 98.4°F | Ht 76.0 in | Wt 238.0 lb

## 2023-09-28 DIAGNOSIS — E119 Type 2 diabetes mellitus without complications: Secondary | ICD-10-CM

## 2023-09-28 DIAGNOSIS — Z7984 Long term (current) use of oral hypoglycemic drugs: Secondary | ICD-10-CM | POA: Diagnosis not present

## 2023-09-28 DIAGNOSIS — I10 Essential (primary) hypertension: Secondary | ICD-10-CM

## 2023-09-28 LAB — POCT GLYCOSYLATED HEMOGLOBIN (HGB A1C): Hemoglobin A1C: 6.4 % — AB (ref 4.0–5.6)

## 2023-09-28 MED ORDER — FREESTYLE LIBRE 3 PLUS SENSOR MISC: 6 refills | Status: DC

## 2023-09-28 NOTE — Progress Notes (Signed)
 Subjective:    Patient ID: Christopher Serrano, male    DOB: 1955/05/03, 68 y.o.   MRN: [id number]  HPI 68 year old male who  has a past medical history of Allergy, ASTHMA (07/25/2006), Asthma, Depression, DIABETES MELLITUS, TYPE II (07/25/2006), History of hiatal hernia, HYPERLIPIDEMIA (01/07/2008), and HYPERTENSION (07/25/2006).  He presents to the office today for follow up regarding DM and HTN   DM - He was last seen in April 2025 at which time his A1 had increased from 7.7 to 11.2. He had not been taking his glipizide  or metformin . Moujaro was increased to 12.5 mg weekly but he has not taken this in 3 months as he had issues with his insurance.  He has been going to [employer] every day and has started riding a bike. He is eating a vegetarian diet.   Lab Results  Component Value Date   HGBA1C 6.4 (A) 09/28/2023   HGBA1C 11.2 (H) 04/28/2023   HGBA1C 11.0 (A) 04/28/2023    He is using a Libre sensor to monitor his blood sugars which showe  CONTINUOUS GLUCOSE MONITORING RECORD INTERPRETATION                 Dates of Recording: June 6- Sept 3 2025   Sensor description: freestyle libre    Results statistics:      Average  124  Time in range 94%  % Time Above 180 5%  % Time above 250 0%  % Time Below target 1%     HTN - he is managed with lisinopril  20 mg. He is tolerating this medication well and has no side effects BP Readings from Last 3 Encounters:  09/28/23 138/86  06/03/23 132/80  04/28/23 130/82    Review of Systems See HPI   Past Medical History:  Diagnosis Date   Allergy    ASTHMA 07/25/2006   Asthma    Depression    DIABETES MELLITUS, TYPE II 07/25/2006   History of hiatal hernia    HYPERLIPIDEMIA 01/07/2008   HYPERTENSION 07/25/2006    Social History   Socioeconomic History   Marital status: Married    Spouse name: Not on file   Number of children: Not on file   Years of education: Not on file   Highest education level: Not on file   Occupational History   Not on file  Tobacco Use   Smoking status: Never   Smokeless tobacco: Never  Substance and Sexual Activity   Alcohol use: Yes    Comment: every 6 -7 mths   Drug use: No   Sexual activity: Not on file  Other Topics Concern   Not on file  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    Social Drivers of Health   Financial Resource Strain: Not on file  Food Insecurity: No Food Insecurity (12/03/2022)   Hunger Vital Sign    Worried About Running Out of Food in the Last Year: Never true    Ran Out of Food in the Last Year: Never true  Transportation Needs: No Transportation Needs (12/03/2022)   PRAPARE - Administrator, Civil Service (Medical): No    Lack of Transportation (Non-Medical): No  Physical Activity: Not on file  Stress: Not on file  Social Connections: Not on file  Intimate Partner Violence: Not At Risk (08/14/2023)   Received from [facility]   HITS    Over the last 12 months how often did your partner physically hurt you?:  Never    Over the last 12 months how often did your partner insult you or talk down to you?: Never    Over the last 12 months how often did your partner threaten you with physical harm?: Never    Over the last 12 months how often did your partner scream or curse at you?: Never    Past Surgical History:  Procedure Laterality Date   SINUS IRRIGATION     VENTRAL HERNIA REPAIR N/A 12/03/2022   Procedure: LAPAROSCOPIC VENTRAL HERNIA REPAIR WITH MESH;  Surgeon: Paola Dreama SAILOR, MD;  Location: [facility] OR;  Service: General;  Laterality: N/A;    Family History  Problem Relation Age of Onset   Hypertension Mother    Dementia Father    Diabetes Brother    Colon cancer Neg Hx    Esophageal cancer Neg Hx    Pancreatic cancer Neg Hx    Rectal cancer Neg Hx    Stomach cancer Neg Hx     Allergies  Allergen Reactions   Aleve [Naproxen] Shortness Of Breath   Aspirin Shortness Of Breath   Ibuprofen Shortness Of  Breath    Current Outpatient Medications on File Prior to Visit  Medication Sig Dispense Refill   acetaminophen  (TYLENOL ) 500 MG tablet Take 2 tablets (1,000 mg total) by mouth 4 (four) times daily. 120 tablet 3   Continuous Blood Gluc Sensor (FREESTYLE LIBRE 3 SENSOR) MISC 1 Device by Does not apply route every 14 (fourteen) days. Place 1 sensor on the skin every 14 days. Use to check glucose continuously 2 each 6   Continuous Glucose Sensor (FREESTYLE LIBRE 3 PLUS SENSOR) MISC Change sensor every 15 days. 2 each 6   fluticasone  (FLONASE ) 50 MCG/ACT nasal spray Place 2 sprays into both nostrils daily. (Patient taking differently: Place 2 sprays into both nostrils daily as needed for allergies.) 16 g 6   Fluticasone -Umeclidin-Vilant (TRELEGY ELLIPTA ) 100-62.5-25 MCG/ACT AEPB Inhale 1 puff into the lungs daily. 60 each 3   glipiZIDE  (GLUCOTROL  XL) 10 MG 24 hr tablet TAKE 1 TABLET(10 MG) BY MOUTH EVERY MORNING 90 tablet 0   ketoconazole  (NIZORAL ) 2 % cream Apply to both feet and between toes once daily for 6 weeks. 60 g 1   lisinopril  (ZESTRIL ) 20 MG tablet TAKE 1 TABLET(20 MG) BY MOUTH DAILY 90 tablet 0   metFORMIN  (GLUCOPHAGE ) 1000 MG tablet TAKE 1 TABLET(1000 MG) BY MOUTH TWICE DAILY WITH A MEAL 180 tablet 0   methocarbamol  (ROBAXIN -750) 750 MG tablet Take 1 tablet (750 mg total) by mouth 4 (four) times daily. 120 tablet 1   Multiple Vitamin (MULTIVITAMIN WITH MINERALS) TABS tablet Take 1 tablet by mouth daily.     oxyCODONE  (ROXICODONE ) 5 MG immediate release tablet Take 1 tablet (5 mg total) by mouth every 4 (four) hours as needed. 15 tablet 0   simvastatin  (ZOCOR ) 40 MG tablet TAKE 1 TABLET(40 MG) BY MOUTH DAILY AT 6 PM 90 tablet 3   tadalafil  (CIALIS ) 20 MG tablet Take 0.5-1 tablets (10-20 mg total) by mouth every other day as needed for erectile dysfunction. 10 tablet 11   No current facility-administered medications on file prior to visit.    BP 138/86   Pulse 84   Temp 98.4 F (36.9  C) (Oral)   Ht 6' 4 (1.93 m)   Wt 238 lb (108 kg)   SpO2 97%   BMI 28.97 kg/m       Objective:   Physical Exam Vitals and nursing  note reviewed.  Constitutional:      Appearance: Normal appearance.  Cardiovascular:     Rate and Rhythm: Normal rate and regular rhythm.     Pulses: Normal pulses.     Heart sounds: Normal heart sounds.  Pulmonary:     Effort: Pulmonary effort is normal.     Breath sounds: Normal breath sounds.  Musculoskeletal:        General: Normal range of motion.  Skin:    General: Skin is warm and dry.     Capillary Refill: Capillary refill takes less than 2 seconds.  Neurological:     Mental Status: He is alert.  Psychiatric:        Mood and Affect: Mood normal.        Behavior: Behavior normal.        Thought Content: Thought content normal.        Judgment: Judgment normal.        Assessment & Plan:  1. Diabetes mellitus treated with oral medication (HCC) (Primary)  - POC HgB A1c- 6.4 - has improved and at goal.  - Will contrinue with metformin  and glipizide  since his insurance does not cover GLP therapy.  - Follow up in 3 months for CPE   2. Essential hypertension - Controlled. No change in medication   Darleene Shape, NP

## 2023-09-28 NOTE — Patient Instructions (Addendum)
 Your A1c has dropped to 6.4 - this right where I want you to be  Lets follow up in 3 months for your annul exam

## 2023-09-30 ENCOUNTER — Ambulatory Visit: Admitting: Adult Health

## 2023-10-05 ENCOUNTER — Telehealth: Payer: Self-pay | Admitting: Adult Health

## 2023-10-05 ENCOUNTER — Other Ambulatory Visit: Payer: Self-pay | Admitting: Adult Health

## 2023-10-05 DIAGNOSIS — E119 Type 2 diabetes mellitus without complications: Secondary | ICD-10-CM

## 2023-10-05 NOTE — Telephone Encounter (Signed)
 Copied from CRM #[id number]. Topic: Referral - Request for Referral >> Oct 05, 2023 10:21 AM Thersia BROCKS wrote: Did the patient discuss referral with their provider in the last year? No  (If No - schedule appointment) (If Yes - send message)  Appointment offered? Yes offered, patient would like to just be seen by an neurologist and not be seen by pcp first   Type of order/referral and detailed reason for visit:Memory Lost  neurologist  Preference of office, provider, location: No Preference   If referral order, have you been seen by this specialty before? No (If Yes, this issue or another issue? When? Where?  Can we respond through MyChart? Yes

## 2023-10-06 NOTE — Telephone Encounter (Signed)
**Note De-identified  [place] Obfuscation** Please advise 

## 2023-10-06 NOTE — Telephone Encounter (Signed)
 Patient notified of update  and verbalized understanding.

## 2023-11-01 ENCOUNTER — Ambulatory Visit (INDEPENDENT_AMBULATORY_CARE_PROVIDER_SITE_OTHER): Payer: PRIVATE HEALTH INSURANCE | Admitting: Adult Health

## 2023-11-01 ENCOUNTER — Encounter: Payer: Self-pay | Admitting: Adult Health

## 2023-11-01 DIAGNOSIS — E119 Type 2 diabetes mellitus without complications: Secondary | ICD-10-CM | POA: Diagnosis not present

## 2023-11-01 DIAGNOSIS — I10 Essential (primary) hypertension: Secondary | ICD-10-CM | POA: Diagnosis not present

## 2023-11-01 DIAGNOSIS — H6993 Unspecified Eustachian tube disorder, bilateral: Secondary | ICD-10-CM | POA: Diagnosis not present

## 2023-11-01 DIAGNOSIS — Z7984 Long term (current) use of oral hypoglycemic drugs: Secondary | ICD-10-CM | POA: Diagnosis not present

## 2023-11-01 MED ORDER — METFORMIN HCL 1000 MG PO TABS: ORAL_TABLET | ORAL | 0 refills | Status: DC

## 2023-11-01 MED ORDER — LISINOPRIL 20 MG PO TABS: ORAL_TABLET | ORAL | 0 refills | Status: DC

## 2023-11-01 MED ORDER — FLUTICASONE PROPIONATE 50 MCG/ACT NA SUSP: 2.0000 | Freq: Every day | NASAL | 1 refills | Status: DC

## 2023-11-01 NOTE — Progress Notes (Signed)
 Subjective:    Patient ID: Christopher Serrano, male    DOB: September 01, 1955, 68 y.o.   MRN: [id number]  Otalgia     68 year old male who  has a past medical history of Allergy, ASTHMA (07/25/2006), Asthma, Depression, DIABETES MELLITUS, TYPE II (07/25/2006), History of hiatal hernia, HYPERLIPIDEMIA (01/07/2008), and HYPERTENSION (07/25/2006).  He presents to the office today for an acute issue. He reports pain in is left ear x 2 weeks. Pain comes and goes. He feels like his ear will pop. Denies fevers, chills, sinus pressure, rhinorrhea, cough or feeling acutely ill.   He also needs a refill on Metformin  that he takes for DM and lisinopril  for HTN   Review of Systems  HENT:  Positive for ear pain.    See HPI   Past Medical History:  Diagnosis Date   Allergy    ASTHMA 07/25/2006   Asthma    Depression    DIABETES MELLITUS, TYPE II 07/25/2006   History of hiatal hernia    HYPERLIPIDEMIA 01/07/2008   HYPERTENSION 07/25/2006    Social History   Socioeconomic History   Marital status: Married    Spouse name: Not on file   Number of children: Not on file   Years of education: Not on file   Highest education level: Not on file  Occupational History   Not on file  Tobacco Use   Smoking status: Never   Smokeless tobacco: Never  Substance and Sexual Activity   Alcohol use: Yes    Comment: every 6 -7 mths   Drug use: No   Sexual activity: Not on file  Other Topics Concern   Not on file  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    Social Drivers of Health   Financial Resource Strain: Not on file  Food Insecurity: No Food Insecurity (12/03/2022)   Hunger Vital Sign    Worried About Running Out of Food in the Last Year: Never true    Ran Out of Food in the Last Year: Never true  Transportation Needs: No Transportation Needs (12/03/2022)   PRAPARE - Administrator, Civil Service (Medical): No    Lack of Transportation (Non-Medical): No  Physical Activity: Not  on file  Stress: Not on file  Social Connections: Not on file  Intimate Partner Violence: Not At Risk (08/14/2023)   Received from [facility]   HITS    Over the last 12 months how often did your partner physically hurt you?: Never    Over the last 12 months how often did your partner insult you or talk down to you?: Never    Over the last 12 months how often did your partner threaten you with physical harm?: Never    Over the last 12 months how often did your partner scream or curse at you?: Never    Past Surgical History:  Procedure Laterality Date   SINUS IRRIGATION     VENTRAL HERNIA REPAIR N/A 12/03/2022   Procedure: LAPAROSCOPIC VENTRAL HERNIA REPAIR WITH MESH;  Surgeon: Paola Dreama SAILOR, MD;  Location: [facility] OR;  Service: General;  Laterality: N/A;    Family History  Problem Relation Age of Onset   Hypertension Mother    Dementia Father    Diabetes Brother    Colon cancer Neg Hx    Esophageal cancer Neg Hx    Pancreatic cancer Neg Hx    Rectal cancer Neg Hx    Stomach cancer Neg Hx  Allergies  Allergen Reactions   Aleve [Naproxen] Shortness Of Breath   Aspirin Shortness Of Breath   Ibuprofen Shortness Of Breath    Current Outpatient Medications on File Prior to Visit  Medication Sig Dispense Refill   acetaminophen  (TYLENOL ) 500 MG tablet Take 2 tablets (1,000 mg total) by mouth 4 (four) times daily. 120 tablet 3   Continuous Glucose Sensor (FREESTYLE LIBRE 3 PLUS SENSOR) MISC Change sensor every 15 days. 2 each 6   Fluticasone -Umeclidin-Vilant (TRELEGY ELLIPTA ) 100-62.5-25 MCG/ACT AEPB Inhale 1 puff into the lungs daily. 60 each 3   glipiZIDE  (GLUCOTROL  XL) 10 MG 24 hr tablet TAKE 1 TABLET(10 MG) BY MOUTH EVERY MORNING 90 tablet 0   ketoconazole  (NIZORAL ) 2 % cream Apply to both feet and between toes once daily for 6 weeks. 60 g 1   methocarbamol  (ROBAXIN -750) 750 MG tablet Take 1 tablet (750 mg total) by mouth 4 (four) times daily. 120 tablet 1   Multiple Vitamin  (MULTIVITAMIN WITH MINERALS) TABS tablet Take 1 tablet by mouth daily.     simvastatin  (ZOCOR ) 40 MG tablet TAKE 1 TABLET(40 MG) BY MOUTH DAILY AT 6 PM 90 tablet 3   tadalafil  (CIALIS ) 20 MG tablet Take 0.5-1 tablets (10-20 mg total) by mouth every other day as needed for erectile dysfunction. 10 tablet 11   No current facility-administered medications on file prior to visit.    BP 138/88   Pulse 89   Temp 98.5 F (36.9 C) (Oral)   Ht 6' 4 (1.93 m)   Wt 238 lb (108 kg)   SpO2 96%   BMI 28.97 kg/m       Objective:   Physical Exam Vitals and nursing note reviewed.  Constitutional:      Appearance: Normal appearance.  HENT:     Right Ear: A middle ear effusion is present. Tympanic membrane is not erythematous or bulging.     Left Ear: A middle ear effusion is present. Tympanic membrane is not erythematous or bulging.  Cardiovascular:     Rate and Rhythm: Normal rate and regular rhythm.     Pulses: Normal pulses.     Heart sounds: Normal heart sounds.  Pulmonary:     Effort: Pulmonary effort is normal.     Breath sounds: Normal breath sounds.  Skin:    General: Skin is warm and dry.  Neurological:     General: No focal deficit present.     Mental Status: He is alert and oriented to person, place, and time.  Psychiatric:        Mood and Affect: Mood normal.        Behavior: Behavior normal.        Thought Content: Thought content normal.        Judgment: Judgment normal.           Assessment & Plan:  1. Dysfunction of both eustachian tubes - ETD noted in bilateral ears, worse on left. Will start on Flonase .  - Follow up if not resolving in the next 1-2 weeks or sooner if symptoms become worse - fluticasone  (FLONASE ) 50 MCG/ACT nasal spray; Place 2 sprays into both nostrils daily.  Dispense: 16 g; Refill: 1  2. Essential hypertension  - lisinopril  (ZESTRIL ) 20 MG tablet; TAKE 1 TABLET(20 MG) BY MOUTH DAILY  Dispense: 90 tablet; Refill: 0  3. Diabetes mellitus  treated with oral medication (HCC)  - metFORMIN  (GLUCOPHAGE ) 1000 MG tablet; TAKE 1 TABLET(1000 MG) BY MOUTH TWICE DAILY WITH A  MEAL  Dispense: 180 tablet; Refill: 0  Darleene Shape, NP

## 2023-11-02 ENCOUNTER — Ambulatory Visit: Admitting: Adult Health

## 2023-11-09 ENCOUNTER — Encounter: Payer: Self-pay | Admitting: Adult Health

## 2023-11-09 ENCOUNTER — Ambulatory Visit (INDEPENDENT_AMBULATORY_CARE_PROVIDER_SITE_OTHER): Admitting: Adult Health

## 2023-11-09 VITALS — BP 140/80 | HR 102 | Temp 98.9°F | Ht 76.0 in | Wt 241.0 lb

## 2023-11-09 DIAGNOSIS — E559 Vitamin D deficiency, unspecified: Secondary | ICD-10-CM | POA: Diagnosis not present

## 2023-11-09 DIAGNOSIS — R4189 Other symptoms and signs involving cognitive functions and awareness: Secondary | ICD-10-CM

## 2023-11-09 DIAGNOSIS — Z7984 Long term (current) use of oral hypoglycemic drugs: Secondary | ICD-10-CM

## 2023-11-09 DIAGNOSIS — I10 Essential (primary) hypertension: Secondary | ICD-10-CM

## 2023-11-09 DIAGNOSIS — E538 Deficiency of other specified B group vitamins: Secondary | ICD-10-CM | POA: Diagnosis not present

## 2023-11-09 DIAGNOSIS — E119 Type 2 diabetes mellitus without complications: Secondary | ICD-10-CM | POA: Diagnosis not present

## 2023-11-09 LAB — CBC
HCT: 37.7 % — ABNORMAL LOW (ref 39.0–52.0)
Hemoglobin: 11.8 g/dL — ABNORMAL LOW (ref 13.0–17.0)
MCHC: 31.2 g/dL (ref 30.0–36.0)
MCV: 74 fl — ABNORMAL LOW (ref 78.0–100.0)
Platelets: 259 K/uL (ref 150.0–400.0)
RBC: 5.1 Mil/uL (ref 4.22–5.81)
RDW: 16 % — ABNORMAL HIGH (ref 11.5–15.5)
WBC: 7.6 K/uL (ref 4.0–10.5)

## 2023-11-09 LAB — URINALYSIS, ROUTINE W REFLEX MICROSCOPIC
Bilirubin Urine: NEGATIVE
Hgb urine dipstick: NEGATIVE
Leukocytes,Ua: NEGATIVE
Nitrite: NEGATIVE
Specific Gravity, Urine: >=1.03 — AB (ref 1.000–1.030)
Total Protein, Urine: 30 — AB
Urine Glucose: NEGATIVE
Urobilinogen, UA: 0.2 (ref 0.0–1.0)
pH: 5.5 (ref 5.0–8.0)

## 2023-11-09 LAB — VITAMIN B12: Vitamin B-12: 364 pg/mL (ref 211–911)

## 2023-11-09 LAB — VITAMIN D 25 HYDROXY (VIT D DEFICIENCY, FRACTURES): VITD: 15.35 ng/mL — ABNORMAL LOW (ref 30.00–100.00)

## 2023-11-09 LAB — TSH: TSH: 0.95 u[IU]/mL (ref 0.35–5.50)

## 2023-11-09 NOTE — Progress Notes (Signed)
 Subjective:    Patient ID: Christopher Serrano, male    DOB: April 03, 1955, 68 y.o.   MRN: [id number]  HPI Discussed the use of AI scribe software for clinical note transcription with the patient, who gave verbal consent to proceed.  History of Present Illness   Christopher Serrano is a 68 year old male who presents with episodes of transient memory loss. He is accompanied by his wife, who provides additional information about his condition.  He experiences episodes of waking up disoriented, not knowing where he is or who he is, but always remembers his wife. These episodes began last year and have occurred five to six times, lasting up to fifteen minutes. He describes them as brief and mostly occurring at home. His wife believes they are becoming more frequent, although he does not. He does not get lost while driving or forget family members' names but misplaces items around the house. His wife notes frequent spacing out, sitting in his man cave for hours without engaging in activities.  No difficulty falling asleep, snoring, or apneic episodes. Family history includes dementia in both parents. Blood sugar have been stable.  No chest pain or shortness of breath.        Review of Systems See HPI   Past Medical History:  Diagnosis Date   Allergy    ASTHMA 07/25/2006   Asthma    Depression    DIABETES MELLITUS, TYPE II 07/25/2006   History of hiatal hernia    HYPERLIPIDEMIA 01/07/2008   HYPERTENSION 07/25/2006    Social History   Socioeconomic History   Marital status: Married    Spouse name: Not on file   Number of children: Not on file   Years of education: Not on file   Highest education level: Not on file  Occupational History   Not on file  Tobacco Use   Smoking status: Never   Smokeless tobacco: Never  Substance and Sexual Activity   Alcohol use: Yes    Comment: every 6 -7 mths   Drug use: No   Sexual activity: Not on file  Other Topics Concern   Not on file  Social History  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    Social Drivers of Health   Financial Resource Strain: Not on file  Food Insecurity: No Food Insecurity (12/03/2022)   Hunger Vital Sign    Worried About Running Out of Food in the Last Year: Never true    Ran Out of Food in the Last Year: Never true  Transportation Needs: No Transportation Needs (12/03/2022)   PRAPARE - Administrator, Civil Service (Medical): No    Lack of Transportation (Non-Medical): No  Physical Activity: Not on file  Stress: Not on file  Social Connections: Not on file  Intimate Partner Violence: Not At Risk (08/14/2023)   Received from [facility]   HITS    Over the last 12 months how often did your partner physically hurt you?: Never    Over the last 12 months how often did your partner insult you or talk down to you?: Never    Over the last 12 months how often did your partner threaten you with physical harm?: Never    Over the last 12 months how often did your partner scream or curse at you?: Never    Past Surgical History:  Procedure Laterality Date   SINUS IRRIGATION     VENTRAL HERNIA REPAIR N/A 12/03/2022   Procedure: LAPAROSCOPIC VENTRAL HERNIA  REPAIR WITH MESH;  Surgeon: Paola Dreama SAILOR, MD;  Location: [facility] OR;  Service: General;  Laterality: N/A;    Family History  Problem Relation Age of Onset   Dementia Mother    Hypertension Mother    Dementia Father    Diabetes Brother    Colon cancer Neg Hx    Esophageal cancer Neg Hx    Pancreatic cancer Neg Hx    Rectal cancer Neg Hx    Stomach cancer Neg Hx     Allergies  Allergen Reactions   Aleve [Naproxen] Shortness Of Breath   Aspirin Shortness Of Breath   Ibuprofen Shortness Of Breath    Current Outpatient Medications on File Prior to Visit  Medication Sig Dispense Refill   acetaminophen  (TYLENOL ) 500 MG tablet Take 2 tablets (1,000 mg total) by mouth 4 (four) times daily. 120 tablet 3   Continuous Glucose Sensor (FREESTYLE LIBRE 3 PLUS  SENSOR) MISC Change sensor every 15 days. 2 each 6   fluticasone  (FLONASE ) 50 MCG/ACT nasal spray Place 2 sprays into both nostrils daily. 16 g 1   Fluticasone -Umeclidin-Vilant (TRELEGY ELLIPTA ) 100-62.5-25 MCG/ACT AEPB Inhale 1 puff into the lungs daily. 60 each 3   glipiZIDE  (GLUCOTROL  XL) 10 MG 24 hr tablet TAKE 1 TABLET(10 MG) BY MOUTH EVERY MORNING 90 tablet 0   ketoconazole  (NIZORAL ) 2 % cream Apply to both feet and between toes once daily for 6 weeks. 60 g 1   lisinopril  (ZESTRIL ) 20 MG tablet TAKE 1 TABLET(20 MG) BY MOUTH DAILY 90 tablet 0   metFORMIN  (GLUCOPHAGE ) 1000 MG tablet TAKE 1 TABLET(1000 MG) BY MOUTH TWICE DAILY WITH A MEAL 180 tablet 0   methocarbamol  (ROBAXIN -750) 750 MG tablet Take 1 tablet (750 mg total) by mouth 4 (four) times daily. 120 tablet 1   Multiple Vitamin (MULTIVITAMIN WITH MINERALS) TABS tablet Take 1 tablet by mouth daily.     simvastatin  (ZOCOR ) 40 MG tablet TAKE 1 TABLET(40 MG) BY MOUTH DAILY AT 6 PM 90 tablet 3   tadalafil  (CIALIS ) 20 MG tablet Take 0.5-1 tablets (10-20 mg total) by mouth every other day as needed for erectile dysfunction. 10 tablet 11   No current facility-administered medications on file prior to visit.    BP (!) 140/80   Pulse (!) 102   Temp 98.9 F (37.2 C) (Oral)   Ht 6' 4 (1.93 m)   Wt 241 lb (109.3 kg)   SpO2 97%   BMI 29.34 kg/m       Objective:   Physical Exam Vitals and nursing note reviewed.  Constitutional:      Appearance: Normal appearance. He is obese.  Cardiovascular:     Rate and Rhythm: Normal rate and regular rhythm.     Pulses: Normal pulses.     Heart sounds: Normal heart sounds.  Pulmonary:     Effort: Pulmonary effort is normal.     Breath sounds: Normal breath sounds.  Skin:    General: Skin is warm and dry.  Neurological:     General: No focal deficit present.     Mental Status: He is alert and oriented to person, place, and time.     Cranial Nerves: No cranial nerve deficit.     Sensory: No  sensory deficit.     Motor: No weakness.     Coordination: Coordination normal.     Gait: Gait normal.     Deep Tendon Reflexes: Reflexes normal.  Psychiatric:        Mood  and Affect: Mood normal.        Behavior: Behavior normal.        Thought Content: Thought content normal.        Judgment: Judgment normal.        Assessment & Plan:  1. Cognitive impairment (Primary) MMSE = 29/30.  - There is some concern due to reports from his wife and himself. Will check labs today and then likely order MRI of the brain. Consider referral to neuropsychic testing  - Vitamin B12; Future - VITAMIN D 25 Hydroxy (Vit-D Deficiency, Fractures); Future - TSH; Future - CBC; Future - Urinalysis; Future - CBC - TSH - VITAMIN D 25 Hydroxy (Vit-D Deficiency, Fractures) - Vitamin B12  2. Vitamin D deficiency  - VITAMIN D 25 Hydroxy (Vit-D Deficiency, Fractures); Future - VITAMIN D 25 Hydroxy (Vit-D Deficiency, Fractures)  3. Vitamin B12 deficiency  - Vitamin B12; Future - Vitamin B12  4. Diabetes mellitus treated with oral medication (HCC) - Was at goal of 6.4 last month. Does not sound like he is hypoglycemic episodes any longer  - TSH; Future - TSH  5. Essential hypertension - Slightly elevated today  - Check BP at home   Darleene Shape, NP

## 2023-11-10 ENCOUNTER — Ambulatory Visit: Payer: Self-pay | Admitting: Adult Health

## 2023-11-10 ENCOUNTER — Other Ambulatory Visit: Payer: Self-pay | Admitting: Adult Health

## 2023-11-10 DIAGNOSIS — R4189 Other symptoms and signs involving cognitive functions and awareness: Secondary | ICD-10-CM

## 2023-11-10 DIAGNOSIS — E559 Vitamin D deficiency, unspecified: Secondary | ICD-10-CM

## 2023-11-10 MED ORDER — VITAMIN D (ERGOCALCIFEROL) 1.25 MG (50000 UNIT) PO CAPS: 50000.0000 [IU] | ORAL_CAPSULE | ORAL | 1 refills | Status: AC

## 2023-11-22 ENCOUNTER — Ambulatory Visit (HOSPITAL_BASED_OUTPATIENT_CLINIC_OR_DEPARTMENT_OTHER)
Admission: RE | Admit: 2023-11-22 | Discharge: 2023-11-22 | Disposition: A | Discharge status: Home / Self Care | Source: Ambulatory Visit | Attending: Adult Health | Admitting: Adult Health

## 2023-11-22 DIAGNOSIS — R4189 Other symptoms and signs involving cognitive functions and awareness: Secondary | ICD-10-CM | POA: Diagnosis present

## 2023-11-30 ENCOUNTER — Other Ambulatory Visit: Payer: Self-pay | Admitting: Adult Health

## 2023-11-30 ENCOUNTER — Ambulatory Visit: Payer: Self-pay | Admitting: Adult Health

## 2023-11-30 ENCOUNTER — Telehealth: Payer: Self-pay

## 2023-11-30 DIAGNOSIS — J0100 Acute maxillary sinusitis, unspecified: Secondary | ICD-10-CM

## 2023-11-30 MED ORDER — PREDNISONE 10 MG PO TABS: 10.0000 mg | ORAL_TABLET | Freq: Every day | ORAL | 0 refills | Status: DC

## 2023-11-30 MED ORDER — AMOXICILLIN-POT CLAVULANATE 875-125 MG PO TABS: 1.0000 | ORAL_TABLET | Freq: Two times a day (BID) | ORAL | 0 refills | Status: DC

## 2023-11-30 NOTE — Telephone Encounter (Signed)
 Copied from CRM #[id number]. Topic: Clinical - Lab/Test Results >> Nov 30, 2023  3:28 PM Rea ORN wrote: Reason for CRM: Pt wife Jerelene called to get MRI results for pt.   Please call Belinda back [phone] as she stated mentally the pt is not doing well.

## 2023-11-30 NOTE — Telephone Encounter (Signed)
 Patient notified of update  and verbalized understanding.

## 2023-12-05 ENCOUNTER — Other Ambulatory Visit: Payer: Self-pay | Admitting: Adult Health

## 2023-12-05 DIAGNOSIS — E119 Type 2 diabetes mellitus without complications: Secondary | ICD-10-CM

## 2023-12-05 DIAGNOSIS — H6993 Unspecified Eustachian tube disorder, bilateral: Secondary | ICD-10-CM

## 2023-12-28 ENCOUNTER — Encounter (INDEPENDENT_AMBULATORY_CARE_PROVIDER_SITE_OTHER): Payer: Self-pay

## 2023-12-29 ENCOUNTER — Encounter: Admitting: Adult Health

## 2023-12-29 NOTE — Progress Notes (Deleted)
 Subjective:    Patient ID: Christopher Serrano, male    DOB: 08/23/1955, 68 y.o.   MRN: [id number]  HPI Patient presents for yearly preventative medicine examination. He is a pleasant 68 year old male who  has a past medical history of Allergy, ASTHMA (07/25/2006), Asthma, Depression, DIABETES MELLITUS, TYPE II (07/25/2006), History of hiatal hernia, HYPERLIPIDEMIA (01/07/2008), and HYPERTENSION (07/25/2006).  DM - He is currently managed with metformin  1000 mg BID  and glipizide  10 mg XR daily.  He has been going to [employer] every day and has started riding a bike. He is eating a vegetarian diet.  Lab Results  Component Value Date   HGBA1C 6.4 (A) 09/28/2023   HGBA1C 11.2 (H) 04/28/2023   HGBA1C 11.0 (A) 04/28/2023   He is using a Libre sensor to monitor his blood sugars which showe  CONTINUOUS GLUCOSE MONITORING RECORD INTERPRETATION                 Dates of Recording: June 6- Sept 3 2025   Sensor description: freestyle libre    Results statistics:      Average  124  Time in range 94%  % Time Above 180 5%  % Time above 250 0%  % Time Below target 1%     HTN - he is managed with lisinopril  20 mg. He is tolerating this medication well and has no side effects BP Readings from Last 3 Encounters:  11/09/23 (!) 140/80  11/01/23 138/88  09/28/23 138/86    Hyperlipidemia - manages with simvastatin  40 mg nightly.  He denies myalgia or fatigue. Lab Results  Component Value Date   CHOL 135 08/05/2022   HDL 45.40 08/05/2022   LDLCALC 75 08/05/2022   LDLDIRECT 150.4 03/09/2010   TRIG 74.0 08/05/2022   CHOLHDL 3 08/05/2022    ED - uses Viagra  20 mg PRN  All immunizations and health maintenance protocols were reviewed with the patient and needed orders were placed.  Appropriate screening laboratory values were ordered for the patient including screening of hyperlipidemia, renal function and hepatic function. If indicated by BPH, a PSA was ordered.  Medication  reconciliation,  past medical history, social history, problem list and allergies were reviewed in detail with the patient  Goals were established with regard to weight loss, exercise, and  diet in compliance with medications  He is up to date on routine colon cancer screening    Review of Systems  Constitutional: Negative.   HENT: Negative.    Eyes: Negative.   Respiratory: Negative.    Cardiovascular: Negative.   Gastrointestinal: Negative.   Endocrine: Negative.   Genitourinary: Negative.   Musculoskeletal: Negative.   Skin: Negative.   Allergic/Immunologic: Negative.   Neurological: Negative.   Hematological: Negative.   Psychiatric/Behavioral: Negative.    All other systems reviewed and are negative.  Past Medical History:  Diagnosis Date   Allergy    ASTHMA 07/25/2006   Asthma    Depression    DIABETES MELLITUS, TYPE II 07/25/2006   History of hiatal hernia    HYPERLIPIDEMIA 01/07/2008   HYPERTENSION 07/25/2006    Social History   Socioeconomic History   Marital status: Married    Spouse name: Not on file   Number of children: Not on file   Years of education: Not on file   Highest education level: Not on file  Occupational History   Not on file  Tobacco Use   Smoking status: Never   Smokeless tobacco: Never  Substance and Sexual Activity   Alcohol use: Yes    Comment: every 6 -7 mths   Drug use: No   Sexual activity: Not on file  Other Topics Concern   Not on file  Social History 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    Social Drivers of Health   Financial Resource Strain: Not on file  Food Insecurity: No Food Insecurity (12/03/2022)   Hunger Vital Sign    Worried About Running Out of Food in the Last Year: Never true    Ran Out of Food in the Last Year: Never true  Transportation Needs: No Transportation Needs (12/03/2022)   PRAPARE - Administrator, Civil Service (Medical): No    Lack of Transportation (Non-Medical): No  Physical  Activity: Not on file  Stress: Not on file  Social Connections: Not on file  Intimate Partner Violence: Not At Risk (08/14/2023)   Received from [facility]   HITS    Over the last 12 months how often did your partner physically hurt you?: Never    Over the last 12 months how often did your partner insult you or talk down to you?: Never    Over the last 12 months how often did your partner threaten you with physical harm?: Never    Over the last 12 months how often did your partner scream or curse at you?: Never    Past Surgical History:  Procedure Laterality Date   SINUS IRRIGATION     VENTRAL HERNIA REPAIR N/A 12/03/2022   Procedure: LAPAROSCOPIC VENTRAL HERNIA REPAIR WITH MESH;  Surgeon: Paola Dreama SAILOR, MD;  Location: [facility] OR;  Service: General;  Laterality: N/A;    Family History  Problem Relation Age of Onset   Dementia Mother    Hypertension Mother    Dementia Father    Diabetes Brother    Colon cancer Neg Hx    Esophageal cancer Neg Hx    Pancreatic cancer Neg Hx    Rectal cancer Neg Hx    Stomach cancer Neg Hx     Allergies  Allergen Reactions   Aleve [Naproxen] Shortness Of Breath   Aspirin Shortness Of Breath   Ibuprofen Shortness Of Breath    Current Outpatient Medications on File Prior to Visit  Medication Sig Dispense Refill   Continuous Glucose Sensor (FREESTYLE LIBRE 3 PLUS SENSOR) MISC Change sensor every 15 days. 2 each 6   fluticasone  (FLONASE ) 50 MCG/ACT nasal spray SHAKE LIQUID AND USE 2 SPRAYS IN EACH NOSTRIL DAILY 16 g 1   Fluticasone -Umeclidin-Vilant (TRELEGY ELLIPTA ) 100-62.5-25 MCG/ACT AEPB Inhale 1 puff into the lungs daily. 60 each 3   glipiZIDE  (GLUCOTROL  XL) 10 MG 24 hr tablet TAKE 1 TABLET(10 MG) BY MOUTH EVERY MORNING 90 tablet 0   ketoconazole  (NIZORAL ) 2 % cream Apply to both feet and between toes once daily for 6 weeks. 60 g 1   lisinopril  (ZESTRIL ) 20 MG tablet TAKE 1 TABLET(20 MG) BY MOUTH DAILY 90 tablet 0   metFORMIN  (GLUCOPHAGE )  1000 MG tablet TAKE 1 TABLET(1000 MG) BY MOUTH TWICE DAILY WITH A MEAL 180 tablet 0   methocarbamol  (ROBAXIN -750) 750 MG tablet Take 1 tablet (750 mg total) by mouth 4 (four) times daily. 120 tablet 1   Multiple Vitamin (MULTIVITAMIN WITH MINERALS) TABS tablet Take 1 tablet by mouth daily.     simvastatin  (ZOCOR ) 40 MG tablet TAKE 1 TABLET(40 MG) BY MOUTH DAILY AT 6 PM 90 tablet 3   tadalafil  (CIALIS )  20 MG tablet Take 0.5-1 tablets (10-20 mg total) by mouth every other day as needed for erectile dysfunction. 10 tablet 11   Vitamin D , Ergocalciferol , (DRISDOL ) 1.25 MG (50000 UNIT) CAPS capsule Take 1 capsule (50,000 Units total) by mouth every 7 (seven) days. 12 capsule 1   No current facility-administered medications on file prior to visit.    There were no vitals taken for this visit.      Objective:   Physical Exam Vitals and nursing note reviewed.  Constitutional:      General: He is not in acute distress.    Appearance: Normal appearance. He is not ill-appearing.  HENT:     Head: Normocephalic and atraumatic.     Right Ear: Tympanic membrane, ear canal and external ear normal. There is no impacted cerumen.     Left Ear: Tympanic membrane, ear canal and external ear normal. There is no impacted cerumen.     Nose: Nose normal. No congestion or rhinorrhea.     Mouth/Throat:     Mouth: Mucous membranes are moist.     Pharynx: Oropharynx is clear.  Eyes:     Extraocular Movements: Extraocular movements intact.     Conjunctiva/sclera: Conjunctivae normal.     Pupils: Pupils are equal, round, and reactive to light.  Neck:     Vascular: No carotid bruit.  Cardiovascular:     Rate and Rhythm: Normal rate and regular rhythm.     Pulses: Normal pulses.     Heart sounds: No murmur heard.    No friction rub. No gallop.  Pulmonary:     Effort: Pulmonary effort is normal.     Breath sounds: Normal breath sounds.  Abdominal:     General: Abdomen is flat. Bowel sounds are normal. There  is no distension.     Palpations: Abdomen is soft. There is no mass.     Tenderness: There is no abdominal tenderness. There is no guarding or rebound.     Hernia: No hernia is present.  Musculoskeletal:        General: Normal range of motion.     Cervical back: Normal range of motion and neck supple.  Lymphadenopathy:     Cervical: No cervical adenopathy.  Skin:    General: Skin is warm and dry.     Capillary Refill: Capillary refill takes less than 2 seconds.  Neurological:     General: No focal deficit present.     Mental Status: He is alert and oriented to person, place, and time.  Psychiatric:        Mood and Affect: Mood normal.        Behavior: Behavior normal.        Thought Content: Thought content normal.        Judgment: Judgment normal.        Assessment & Plan:

## 2024-01-05 ENCOUNTER — Ambulatory Visit: Admitting: Adult Health

## 2024-01-05 ENCOUNTER — Encounter: Payer: Self-pay | Admitting: Adult Health

## 2024-01-05 VITALS — BP 138/78 | HR 89 | Temp 98.8°F | Ht 71.0 in | Wt 240.0 lb

## 2024-01-05 DIAGNOSIS — Z7984 Long term (current) use of oral hypoglycemic drugs: Secondary | ICD-10-CM | POA: Diagnosis not present

## 2024-01-05 DIAGNOSIS — E119 Type 2 diabetes mellitus without complications: Secondary | ICD-10-CM

## 2024-01-05 DIAGNOSIS — N529 Male erectile dysfunction, unspecified: Secondary | ICD-10-CM | POA: Diagnosis not present

## 2024-01-05 DIAGNOSIS — I10 Essential (primary) hypertension: Secondary | ICD-10-CM

## 2024-01-05 DIAGNOSIS — E785 Hyperlipidemia, unspecified: Secondary | ICD-10-CM

## 2024-01-05 DIAGNOSIS — Z23 Encounter for immunization: Secondary | ICD-10-CM

## 2024-01-05 DIAGNOSIS — N400 Enlarged prostate without lower urinary tract symptoms: Secondary | ICD-10-CM

## 2024-01-05 DIAGNOSIS — Z0000 Encounter for general adult medical examination without abnormal findings: Secondary | ICD-10-CM

## 2024-01-05 LAB — COMPREHENSIVE METABOLIC PANEL WITH GFR
ALT: 13 U/L (ref 0–53)
AST: 17 U/L (ref 0–37)
Albumin: 4.8 g/dL (ref 3.5–5.2)
Alkaline Phosphatase: 63 U/L (ref 39–117)
BUN: 11 mg/dL (ref 6–23)
CO2: 26 meq/L (ref 19–32)
Calcium: 9.9 mg/dL (ref 8.4–10.5)
Chloride: 102 meq/L (ref 96–112)
Creatinine, Ser: 0.94 mg/dL (ref 0.40–1.50)
GFR: 83.88 mL/min (ref >60.00)
Glucose, Bld: 100 mg/dL — ABNORMAL HIGH (ref 70–99)
Potassium: 4.1 meq/L (ref 3.5–5.1)
Sodium: 138 meq/L (ref 135–145)
Total Bilirubin: 0.8 mg/dL (ref 0.2–1.2)
Total Protein: 8 g/dL (ref 6.0–8.3)

## 2024-01-05 LAB — LIPID PANEL
Cholesterol: 123 mg/dL (ref 0–200)
HDL: 50.9 mg/dL (ref >39.00)
LDL Cholesterol: 57 mg/dL (ref 0–99)
NonHDL: 71.63
Total CHOL/HDL Ratio: 2
Triglycerides: 75 mg/dL (ref 0.0–149.0)
VLDL: 15 mg/dL (ref 0.0–40.0)

## 2024-01-05 LAB — MICROALBUMIN / CREATININE URINE RATIO
Creatinine,U: 218.6 mg/dL
Microalb Creat Ratio: 279.1 mg/g — ABNORMAL HIGH (ref 0.0–30.0)
Microalb, Ur: 61 mg/dL — ABNORMAL HIGH (ref 0.0–1.9)

## 2024-01-05 LAB — CBC
HCT: 40.9 % (ref 39.0–52.0)
Hemoglobin: 12.8 g/dL — ABNORMAL LOW (ref 13.0–17.0)
MCHC: 31.3 g/dL (ref 30.0–36.0)
MCV: 72.8 fl — ABNORMAL LOW (ref 78.0–100.0)
Platelets: 252 K/uL (ref 150.0–400.0)
RBC: 5.62 Mil/uL (ref 4.22–5.81)
RDW: 19.1 % — ABNORMAL HIGH (ref 11.5–15.5)
WBC: 7.2 K/uL (ref 4.0–10.5)

## 2024-01-05 LAB — HEMOGLOBIN A1C: Hgb A1c MFr Bld: 7.5 % — ABNORMAL HIGH (ref 4.6–6.5)

## 2024-01-05 LAB — TSH: TSH: 1.01 u[IU]/mL (ref 0.35–5.50)

## 2024-01-05 LAB — PSA: PSA: 2.13 ng/mL (ref 0.10–4.00)

## 2024-01-05 MED ORDER — FREESTYLE LIBRE 3 PLUS SENSOR MISC: 6 refills | Status: AC

## 2024-01-05 MED ORDER — BLOOD PRESSURE KIT: PACK | 0 refills | Status: AC

## 2024-01-05 NOTE — Patient Instructions (Signed)
 It was great seeing you today   We will follow up with you regarding your lab work   Please let me know if you need anything   Let me know what your blood pressure at home are   Follow up in 3 months

## 2024-01-05 NOTE — Progress Notes (Addendum)
 Subjective:    Patient ID: Christopher Serrano, male    DOB: [birth year], 68 y.o.   MRN: [id number]  HPI Patient presents for yearly follow up examination. He is a pleasant 68 year old male who  has a past medical history of Allergy, ASTHMA (07/25/2006), Asthma, Depression, DIABETES MELLITUS, TYPE II (07/25/2006), History of hiatal hernia, HYPERLIPIDEMIA (01/07/2008), and HYPERTENSION (07/25/2006).  DM - He is currently managed with metformin  1000 mg BID  and glipizide  10 mg XR daily.  He has been going to [employer] every day and has started riding a bike. He is eating a vegetarian diet.  Lab Results  Component Value Date   HGBA1C 6.4 (A) 09/28/2023   HGBA1C 11.2 (H) 04/28/2023   HGBA1C 11.0 (A) 04/28/2023   He is using a Libre sensor to monitor his blood sugars which showe  CONTINUOUS GLUCOSE MONITORING RECORD INTERPRETATION                 Dates of Recording: September 13 - December 11th 2025   Sensor description: freestyle libre    Results statistics:      Average  134  Time in range 91%  % Time Above 180 9%  % Time above 250 0%  % Time Below target 0%    HTN - he is managed with lisinopril  20 mg. He is tolerating this medication well and has no side effects BP Readings from Last 3 Encounters:  01/05/24 138/78  11/09/23 (!) 140/80  11/01/23 138/88   Hyperlipidemia - manages with simvastatin  40 mg nightly.  He denies myalgia or fatigue. Lab Results  Component Value Date   CHOL 135 08/05/2022   HDL 45.40 08/05/2022   LDLCALC 75 08/05/2022   LDLDIRECT 150.4 03/09/2010   TRIG 74.0 08/05/2022   CHOLHDL 3 08/05/2022    ED - uses Viagra  20 mg PRN  All immunizations and health maintenance protocols were reviewed with the patient and needed orders were placed.  Appropriate screening laboratory values were ordered for the patient including screening of hyperlipidemia, renal function and hepatic function. If indicated by BPH, a PSA was ordered.  Medication  reconciliation,  past medical history, social history, problem list and allergies were reviewed in detail with the patient  Goals were established with regard to weight loss, exercise, and  diet in compliance with medications. He eats a vegetarian diet and goes to the gym most days of the week.   Wt Readings from Last 3 Encounters:  01/05/24 240 lb (108.9 kg)  11/09/23 241 lb (109.3 kg)  11/01/23 238 lb (108 kg)   He is up to date on routine colon cancer screening   He has no acute complaints.    Review of Systems  Constitutional: Negative.   HENT: Negative.    Eyes: Negative.   Respiratory: Negative.    Cardiovascular: Negative.   Gastrointestinal: Negative.   Endocrine: Negative.   Genitourinary: Negative.   Musculoskeletal: Negative.   Skin: Negative.   Allergic/Immunologic: Negative.   Neurological: Negative.   Hematological: Negative.   Psychiatric/Behavioral: Negative.    All other systems reviewed and are negative.  Past Medical History:  Diagnosis Date   Allergy    ASTHMA 07/25/2006   Asthma    Depression    DIABETES MELLITUS, TYPE II 07/25/2006   History of hiatal hernia    HYPERLIPIDEMIA 01/07/2008   HYPERTENSION 07/25/2006    Social History   Socioeconomic History   Marital status: Married    Spouse name: Not  on file   Number of children: Not on file   Years of education: Not on file   Highest education level: Not on file  Occupational History   Not on file  Tobacco Use   Smoking status: Never   Smokeless tobacco: Never  Substance and Sexual Activity   Alcohol use: Yes    Comment: every 6 -7 mths   Drug use: No   Sexual activity: Not on file  Other Topics Concern   Not on file  Social History 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    Social Drivers of Health   Tobacco Use: Low Risk (01/05/2024)   Patient History    Smoking Tobacco Use: Never    Smokeless Tobacco Use: Never    Passive Exposure: Not on file  Financial Resource Strain: Not on  file  Food Insecurity: No Food Insecurity (12/03/2022)   Hunger Vital Sign    Worried About Running Out of Food in the Last Year: Never true    Ran Out of Food in the Last Year: Never true  Transportation Needs: No Transportation Needs (12/03/2022)   PRAPARE - Administrator, Civil Service (Medical): No    Lack of Transportation (Non-Medical): No  Physical Activity: Not on file  Stress: Not on file  Social Connections: Not on file  Intimate Partner Violence: Not At Risk (08/14/2023)   Received from [facility]   HITS    Over the last 12 months how often did your partner physically hurt you?: Never    Over the last 12 months how often did your partner insult you or talk down to you?: Never    Over the last 12 months how often did your partner threaten you with physical harm?: Never    Over the last 12 months how often did your partner scream or curse at you?: Never  Depression (PHQ2-9): Low Risk (01/05/2024)   Depression (PHQ2-9)    PHQ-2 Score: 0  Alcohol Screen: Not on file  Housing: Low Risk (12/03/2022)   Housing    Last Housing Risk Score: 0  Utilities: Not At Risk (12/03/2022)   [facility] Utilities    Threatened with loss of utilities: No  Health Literacy: Not on file    Past Surgical History:  Procedure Laterality Date   SINUS IRRIGATION     VENTRAL HERNIA REPAIR N/A 12/03/2022   Procedure: LAPAROSCOPIC VENTRAL HERNIA REPAIR WITH MESH;  Surgeon: Paola Dreama SAILOR, MD;  Location: [facility] OR;  Service: General;  Laterality: N/A;    Family History  Problem Relation Age of Onset   Dementia Mother    Hypertension Mother    Dementia Father    Diabetes Brother    Colon cancer Neg Hx    Esophageal cancer Neg Hx    Pancreatic cancer Neg Hx    Rectal cancer Neg Hx    Stomach cancer Neg Hx     Allergies  Allergen Reactions   Aleve [Naproxen] Shortness Of Breath   Aspirin Shortness Of Breath   Ibuprofen Shortness Of Breath    Current Outpatient Medications on File  Prior to Visit  Medication Sig Dispense Refill   fluticasone  (FLONASE ) 50 MCG/ACT nasal spray SHAKE LIQUID AND USE 2 SPRAYS IN EACH NOSTRIL DAILY 16 g 1   Fluticasone -Umeclidin-Vilant (TRELEGY ELLIPTA ) 100-62.5-25 MCG/ACT AEPB Inhale 1 puff into the lungs daily. 60 each 3   glipiZIDE  (GLUCOTROL  XL) 10 MG 24 hr tablet TAKE 1 TABLET(10 MG) BY MOUTH EVERY MORNING 90 tablet  0   ketoconazole  (NIZORAL ) 2 % cream Apply to both feet and between toes once daily for 6 weeks. 60 g 1   lisinopril  (ZESTRIL ) 20 MG tablet TAKE 1 TABLET(20 MG) BY MOUTH DAILY 90 tablet 0   metFORMIN  (GLUCOPHAGE ) 1000 MG tablet TAKE 1 TABLET(1000 MG) BY MOUTH TWICE DAILY WITH A MEAL 180 tablet 0   methocarbamol  (ROBAXIN -750) 750 MG tablet Take 1 tablet (750 mg total) by mouth 4 (four) times daily. 120 tablet 1   Multiple Vitamin (MULTIVITAMIN WITH MINERALS) TABS tablet Take 1 tablet by mouth daily.     simvastatin  (ZOCOR ) 40 MG tablet TAKE 1 TABLET(40 MG) BY MOUTH DAILY AT 6 PM 90 tablet 3   tadalafil  (CIALIS ) 20 MG tablet Take 0.5-1 tablets (10-20 mg total) by mouth every other day as needed for erectile dysfunction. 10 tablet 11   Vitamin D , Ergocalciferol , (DRISDOL ) 1.25 MG (50000 UNIT) CAPS capsule Take 1 capsule (50,000 Units total) by mouth every 7 (seven) days. 12 capsule 1   No current facility-administered medications on file prior to visit.    BP 138/78   Pulse 89   Temp 98.8 F (37.1 C) (Oral)   Ht 5' 11 (1.803 m)   Wt 240 lb (108.9 kg)   SpO2 95%   BMI 33.47 kg/m       Objective:   Physical Exam Vitals and nursing note reviewed.  Constitutional:      General: He is not in acute distress.    Appearance: Normal appearance. He is obese. He is not ill-appearing.  HENT:     Head: Normocephalic and atraumatic.     Right Ear: Tympanic membrane, ear canal and external ear normal. There is no impacted cerumen.     Left Ear: Tympanic membrane, ear canal and external ear normal. There is no impacted cerumen.      Nose: Nose normal. No congestion or rhinorrhea.     Mouth/Throat:     Mouth: Mucous membranes are moist.     Pharynx: Oropharynx is clear.  Eyes:     Extraocular Movements: Extraocular movements intact.     Conjunctiva/sclera: Conjunctivae normal.     Pupils: Pupils are equal, round, and reactive to light.  Neck:     Vascular: No carotid bruit.  Cardiovascular:     Rate and Rhythm: Normal rate and regular rhythm.     Pulses: Normal pulses.     Heart sounds: No murmur heard.    No friction rub. No gallop.  Pulmonary:     Effort: Pulmonary effort is normal.     Breath sounds: Normal breath sounds.  Abdominal:     General: Abdomen is flat. Bowel sounds are normal. There is no distension.     Palpations: Abdomen is soft. There is no mass.     Tenderness: There is no abdominal tenderness. There is no guarding or rebound.     Hernia: No hernia is present.  Musculoskeletal:        General: Normal range of motion.     Cervical back: Normal range of motion and neck supple.  Lymphadenopathy:     Cervical: No cervical adenopathy.  Skin:    General: Skin is warm and dry.     Capillary Refill: Capillary refill takes less than 2 seconds.  Neurological:     General: No focal deficit present.     Mental Status: He is alert and oriented to person, place, and time.  Psychiatric:  Mood and Affect: Mood normal.        Behavior: Behavior normal.        Thought Content: Thought content normal.        Judgment: Judgment normal.        Assessment & Plan:   1. Diabetes mellitus treated with oral medication (HCC) - Controlled. Doubt any change in medication  - Follow up in 3 months - Lipid panel; Future - TSH; Future - CBC; Future - Comprehensive metabolic panel with GFR; Future - Hemoglobin A1c; Future - Microalbumin/Creatinine Ratio, Urine; Future - Continuous Glucose Sensor (FREESTYLE LIBRE 3 PLUS SENSOR) MISC; Change sensor every 15 days.  Dispense: 2 each; Refill: 6  2.  Essential hypertension - Will have him monitor BP at home and send me results in 2 weeks - Lipid panel; Future - TSH; Future - CBC; Future - Comprehensive metabolic panel with GFR; Future - Blood Pressure KIT; To check blood pressure  Dispense: 1 kit; Refill: 0  3. Dyslipidemia - Consider increase in statin  - Lipid panel; Future - TSH; Future - CBC; Future - Comprehensive metabolic panel with GFR; Future  4. Erectile dysfunction, unspecified erectile dysfunction type - Continue with sildenafil  as needed - Lipid panel; Future - TSH; Future - CBC; Future - Comprehensive metabolic panel with GFR; Future - Microalbumin/Creatinine Ratio, Urine; Future  5. Need for influenza vaccination  - Flu vaccine HIGH DOSE PF(Fluzone Trivalent)  6. Benign prostatic hyperplasia without lower urinary tract symptoms  - PSA; Future  Darleene Shape, NP

## 2024-01-06 ENCOUNTER — Ambulatory Visit: Payer: Self-pay | Admitting: Adult Health

## 2024-01-06 DIAGNOSIS — E119 Type 2 diabetes mellitus without complications: Secondary | ICD-10-CM

## 2024-01-06 MED ORDER — GLIPIZIDE ER 10 MG PO TB24: 10.0000 mg | ORAL_TABLET | Freq: Two times a day (BID) | ORAL | 0 refills | Status: AC

## 2024-01-10 ENCOUNTER — Other Ambulatory Visit: Payer: Self-pay | Admitting: Adult Health

## 2024-01-10 DIAGNOSIS — E119 Type 2 diabetes mellitus without complications: Secondary | ICD-10-CM

## 2024-01-12 NOTE — Addendum Note (Signed)
 Addended by: MERNA DARLEENE CROME on: 01/12/2024 09:13 AM   Modules accepted: Level of Service

## 2024-01-28 ENCOUNTER — Other Ambulatory Visit: Payer: Self-pay | Admitting: Adult Health

## 2024-01-28 DIAGNOSIS — E119 Type 2 diabetes mellitus without complications: Secondary | ICD-10-CM

## 2024-02-04 ENCOUNTER — Other Ambulatory Visit: Payer: Self-pay | Admitting: Adult Health

## 2024-02-04 DIAGNOSIS — I10 Essential (primary) hypertension: Secondary | ICD-10-CM

## 2024-02-07 ENCOUNTER — Encounter: Payer: Self-pay | Admitting: Adult Health

## 2024-02-07 ENCOUNTER — Ambulatory Visit (INDEPENDENT_AMBULATORY_CARE_PROVIDER_SITE_OTHER): Admitting: Adult Health

## 2024-02-07 VITALS — BP 136/84 | HR 86 | Temp 98.6°F | Ht 71.0 in | Wt 243.0 lb

## 2024-02-07 DIAGNOSIS — B9789 Other viral agents as the cause of diseases classified elsewhere: Secondary | ICD-10-CM | POA: Diagnosis not present

## 2024-02-07 DIAGNOSIS — J988 Other specified respiratory disorders: Secondary | ICD-10-CM

## 2024-02-07 DIAGNOSIS — I10 Essential (primary) hypertension: Secondary | ICD-10-CM

## 2024-02-07 MED ORDER — LISINOPRIL 30 MG PO TABS: 30.0000 mg | ORAL_TABLET | Freq: Every day | ORAL | 1 refills | Status: AC

## 2024-02-07 NOTE — Progress Notes (Signed)
 "  Subjective:    Patient ID: Christopher Serrano, male    DOB: 01-29-1955, 69 y.o.   MRN: [id number]  HPI  Discussed the use of AI scribe software for clinical note transcription with the patient, who gave verbal consent to proceed.  History of Present Illness   Christopher Serrano is a 69 year old male who presents with nasal congestion and fatigue.  He reports 4 days of significant nasal congestion and fatigue, describing difficulty breathing through his nose.  Symptoms are slightly improved today. He denies fever or chills. He had brief cough and wheezing a couple of days ago that have improved. He has used lemon and tea at home.  He has hypertension treated with lisinopril  20mg . Home blood pressures are usually around 136/90, with occasional diastolic values in the 100s.       Review of Systems See HPI   Past Medical History:  Diagnosis Date   Allergy    ASTHMA 07/25/2006   Asthma    Depression    DIABETES MELLITUS, TYPE II 07/25/2006   History of hiatal hernia    HYPERLIPIDEMIA 01/07/2008   HYPERTENSION 07/25/2006    Social History   Socioeconomic History   Marital status: Married    Spouse name: Not on file   Number of children: Not on file   Years of education: Not on file   Highest education level: Not on file  Occupational History   Not on file  Tobacco Use   Smoking status: Never   Smokeless tobacco: Never  Substance and Sexual Activity   Alcohol use: Yes    Comment: every 6 -7 mths   Drug use: No   Sexual activity: Not on file  Other Topics Concern   Not on file  Social History 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    Social Drivers of Health   Tobacco Use: Low Risk (02/07/2024)   Patient History    Smoking Tobacco Use: Never    Smokeless Tobacco Use: Never    Passive Exposure: Not on file  Financial Resource Strain: Not on file  Food Insecurity: No Food Insecurity (12/03/2022)   Hunger Vital Sign    Worried About Running Out of Food in the Last Year: Never true     Ran Out of Food in the Last Year: Never true  Transportation Needs: No Transportation Needs (12/03/2022)   PRAPARE - Administrator, Civil Service (Medical): No    Lack of Transportation (Non-Medical): No  Physical Activity: Not on file  Stress: Not on file  Social Connections: Not on file  Intimate Partner Violence: Not At Risk (08/14/2023)   Received from [facility]   HITS    Over the last 12 months how often did your partner physically hurt you?: Never    Over the last 12 months how often did your partner insult you or talk down to you?: Never    Over the last 12 months how often did your partner threaten you with physical harm?: Never    Over the last 12 months how often did your partner scream or curse at you?: Never  Depression (PHQ2-9): Low Risk (01/05/2024)   Depression (PHQ2-9)    PHQ-2 Score: 0  Alcohol Screen: Not on file  Housing: Low Risk (12/03/2022)   Housing    Last Housing Risk Score: 0  Utilities: Not At Risk (12/03/2022)   [facility] Utilities    Threatened with loss of utilities: No  Health Literacy: Not on file  Past Surgical History:  Procedure Laterality Date   SINUS IRRIGATION     VENTRAL HERNIA REPAIR N/A 12/03/2022   Procedure: LAPAROSCOPIC VENTRAL HERNIA REPAIR WITH MESH;  Surgeon: Paola Dreama SAILOR, MD;  Location: [facility] OR;  Service: General;  Laterality: N/A;    Family History  Problem Relation Age of Onset   Dementia Mother    Hypertension Mother    Dementia Father    Diabetes Brother    Colon cancer Neg Hx    Esophageal cancer Neg Hx    Pancreatic cancer Neg Hx    Rectal cancer Neg Hx    Stomach cancer Neg Hx     Allergies[1]  Medications Ordered Prior to Encounter[2]  BP 136/84   Pulse 86   Temp 98.6 F (37 C) (Oral)   Ht 5' 11 (1.803 m)   Wt 243 lb (110.2 kg)   SpO2 95%   BMI 33.89 kg/m       Objective:   Physical Exam Constitutional:      Appearance: Normal appearance.  HENT:     Nose: No congestion or  rhinorrhea.     Right Turbinates: Not enlarged or swollen.     Left Turbinates: Not enlarged or swollen.     Mouth/Throat:     Pharynx: Oropharynx is clear. Uvula midline. No pharyngeal swelling, oropharyngeal exudate, posterior oropharyngeal erythema or postnasal drip.  Cardiovascular:     Rate and Rhythm: Normal rate and regular rhythm.     Pulses: Normal pulses.     Heart sounds: Normal heart sounds.  Pulmonary:     Effort: Pulmonary effort is normal.     Breath sounds: Normal breath sounds.  Skin:    General: Skin is warm and dry.     Capillary Refill: Capillary refill takes less than 2 seconds.  Neurological:     General: No focal deficit present.     Mental Status: He is alert.  Psychiatric:        Mood and Affect: Mood normal.        Behavior: Behavior normal.        Thought Content: Thought content normal.        Judgment: Judgment normal.           Assessment & Plan:   Assessment and Plan    Viral upper respiratory infection Symptoms consistent with viral infection, improving, expected resolution in 7-10 days. - Recommended saline nasal spray for congestion. - Suggested Flonase  if needed.  Essential hypertension Blood pressure slightly elevated. Increased lisinopril  to improve control. - Increased lisinopril  to 30 mg daily. - Instructed to monitor for hypotension symptoms. - Recheck blood pressure in a couple of months.          [1]  Allergies Allergen Reactions   Aleve [Naproxen] Shortness Of Breath   Aspirin Shortness Of Breath   Ibuprofen Shortness Of Breath  [2]  Current Outpatient Medications on File Prior to Visit  Medication Sig Dispense Refill   Blood Pressure KIT To check blood pressure 1 kit 0   Continuous Glucose Sensor (FREESTYLE LIBRE 3 PLUS SENSOR) MISC Change sensor every 15 days. 2 each 6   fluticasone  (FLONASE ) 50 MCG/ACT nasal spray SHAKE LIQUID AND USE 2 SPRAYS IN EACH NOSTRIL DAILY 16 g 1   Fluticasone -Umeclidin-Vilant (TRELEGY  ELLIPTA) 100-62.5-25 MCG/ACT AEPB Inhale 1 puff into the lungs daily. 60 each 3   glipiZIDE  (GLUCOTROL  XL) 10 MG 24 hr tablet Take 1 tablet (10 mg total) by mouth 2 (two)  times daily. TAKE 1 TABLET(10 MG) BY MOUTH EVERY MORNING 90 tablet 0   ketoconazole  (NIZORAL ) 2 % cream Apply to both feet and between toes once daily for 6 weeks. 60 g 1   metFORMIN  (GLUCOPHAGE ) 1000 MG tablet TAKE 1 TABLET(1000 MG) BY MOUTH TWICE DAILY WITH A MEAL 180 tablet 0   methocarbamol  (ROBAXIN -750) 750 MG tablet Take 1 tablet (750 mg total) by mouth 4 (four) times daily. 120 tablet 1   Multiple Vitamin (MULTIVITAMIN WITH MINERALS) TABS tablet Take 1 tablet by mouth daily.     simvastatin  (ZOCOR ) 40 MG tablet TAKE 1 TABLET(40 MG) BY MOUTH DAILY AT 6 PM 90 tablet 3   tadalafil  (CIALIS ) 20 MG tablet Take 0.5-1 tablets (10-20 mg total) by mouth every other day as needed for erectile dysfunction. 10 tablet 11   Vitamin D , Ergocalciferol , (DRISDOL ) 1.25 MG (50000 UNIT) CAPS capsule Take 1 capsule (50,000 Units total) by mouth every 7 (seven) days. 12 capsule 1   No current facility-administered medications on file prior to visit.   "

## 2024-02-08 ENCOUNTER — Other Ambulatory Visit: Payer: Self-pay | Admitting: Adult Health

## 2024-04-04 ENCOUNTER — Ambulatory Visit: Admitting: Adult Health
